# Patient Record
Sex: Female | Born: 1975 | Race: White | Hispanic: No | Marital: Married | Smoking: Never smoker
Health system: Southern US, Community
[De-identification: ages and names within clinical notes are randomized; demographics above are authoritative.]

## PROBLEM LIST (undated history)

## (undated) DIAGNOSIS — E040 Nontoxic diffuse goiter: Secondary | ICD-10-CM

## (undated) DIAGNOSIS — E012 Iodine-deficiency related (endemic) goiter, unspecified: Secondary | ICD-10-CM

## (undated) DIAGNOSIS — I10 Essential (primary) hypertension: Secondary | ICD-10-CM

## (undated) DIAGNOSIS — K8020 Calculus of gallbladder without cholecystitis without obstruction: Secondary | ICD-10-CM

## (undated) DIAGNOSIS — T7840XA Allergy, unspecified, initial encounter: Secondary | ICD-10-CM

## (undated) DIAGNOSIS — N83202 Unspecified ovarian cyst, left side: Secondary | ICD-10-CM

## (undated) HISTORY — DX: Allergy, unspecified, initial encounter: T78.40XA

## (undated) HISTORY — DX: Calculus of gallbladder without cholecystitis without obstruction: K80.20

## (undated) HISTORY — DX: Nontoxic diffuse goiter: E04.0

## (undated) HISTORY — DX: Unspecified ovarian cyst, left side: N83.202

## (undated) HISTORY — PX: OTHER SURGICAL HISTORY: SHX169

## (undated) HISTORY — DX: Iodine-deficiency related (endemic) goiter, unspecified: E01.2

## (undated) HISTORY — DX: Essential (primary) hypertension: I10

---

## 2016-07-13 LAB — HM PAP SMEAR

## 2016-11-19 ENCOUNTER — Encounter: Payer: Self-pay | Admitting: Certified Nurse Midwife

## 2016-11-23 ENCOUNTER — Ambulatory Visit (INDEPENDENT_AMBULATORY_CARE_PROVIDER_SITE_OTHER): Payer: Self-pay | Admitting: Certified Nurse Midwife

## 2016-11-23 ENCOUNTER — Encounter: Payer: Self-pay | Admitting: Certified Nurse Midwife

## 2016-11-23 VITALS — BP 147/85 | HR 80 | Ht 64.0 in | Wt 123.2 lb

## 2016-11-23 DIAGNOSIS — E049 Nontoxic goiter, unspecified: Secondary | ICD-10-CM | POA: Insufficient documentation

## 2016-11-23 DIAGNOSIS — O26899 Other specified pregnancy related conditions, unspecified trimester: Secondary | ICD-10-CM

## 2016-11-23 DIAGNOSIS — R109 Unspecified abdominal pain: Secondary | ICD-10-CM

## 2016-11-23 DIAGNOSIS — R87620 Atypical squamous cells of undetermined significance on cytologic smear of vagina (ASC-US): Secondary | ICD-10-CM

## 2016-11-23 DIAGNOSIS — K8020 Calculus of gallbladder without cholecystitis without obstruction: Secondary | ICD-10-CM | POA: Insufficient documentation

## 2016-11-23 DIAGNOSIS — R300 Dysuria: Secondary | ICD-10-CM

## 2016-11-23 DIAGNOSIS — N926 Irregular menstruation, unspecified: Secondary | ICD-10-CM

## 2016-11-23 LAB — POCT URINE PREGNANCY: Preg Test, Ur: POSITIVE — AB

## 2016-11-23 NOTE — Progress Notes (Signed)
GYN ENCOUNTER NOTE  Subjective:       Kim Conner is a 41 y.o. G1P0 female here for pregnancy confirmation.   Endorses breast tenderness and nausea. Reports dysuria, and lower abdominal cramping last Saturday and Sunday that has since resolved.   Denies difficulty breathing or respiratory distress, chest pian, abdominal pain, vaginal bleeding, and leg pain or swelling.   She is married. This was a planned pregnancy. She is taking a prenatal vitamin. She recently moved from [place] to be with her husband.    Gynecologic History  Patient's last menstrual period was 09/19/2016.  Estimated date of birth: 06/26/2015  Gestational age: 58 weeks 2 days  Last Pap: 07/2016. Results were: ASCUS; completed in [place] and treated with Tergynan vaginal tablets. Advised to have repeat Pap in three (3) months.   Obstetric History  OB History  Gravida Para Term Preterm AB Living  1            SAB TAB Ectopic Multiple Live Births               # Outcome Date GA Lbr Len/2nd Weight Sex Delivery Anes PTL Lv  1 Current               No Known Allergies  Social History   Social History  . Marital status: Married    Spouse name: N/A  . Number of children: N/A  . Years of education: N/A   Occupational History  . Not on file.   Social History Main Topics  . Smoking status: Never Smoker  . Smokeless tobacco: Never Used  . Alcohol use Yes  . Drug use: No  . Sexual activity: Yes   Other Topics Concern  . Not on file   Social History Narrative  . No narrative on file    History reviewed. No pertinent family history.  The following portions of the patient's history were reviewed and updated as appropriate: allergies, current medications, past family history, past medical history, past social history, past surgical history and problem list.  Review of Systems  Review of Systems - Negative except as noted above History obtained from the patient.   Objective:   BP (!)  147/85   Pulse 80   Ht 5\' 4"  (1.626 m)   Wt 123 lb 3.2 oz (55.9 kg)   LMP 09/19/2016   BMI 21.15 kg/m   Alert and oriented x 4, no apparent distress.   Positive UPT  Assessment:   1. Missed menses - POCT urine pregnancy - Urine Culture  2. Abdominal cramping affecting pregnancy - Urine Culture  3. Dysuria  Plan:   Samples of [place] given.   Reviewed red flag symptoms and when to call.   RTC x 1 week for dating/viability [place] and nurse intake.    Diona Fanti, CNM

## 2016-11-23 NOTE — Patient Instructions (Signed)
Eating Plan for Pregnant Women While you are pregnant, your body will require additional nutrition to help support your growing baby. It is recommended that you consume:  150 additional calories each day during your first trimester.  300 additional calories each day during your second trimester.  300 additional calories each day during your third trimester.  Eating a healthy, well-balanced diet is very important for your health and for your baby's health. You also have a higher need for some vitamins and minerals, such as folic acid, calcium, iron, and vitamin D. What do I need to know about eating during pregnancy?  Do not try to lose weight or go on a diet during pregnancy.  Choose healthy, nutritious foods. Choose  of a sandwich with a glass of milk instead of a candy bar or a high-calorie sugar-sweetened beverage.  Limit your overall intake of foods that have "empty calories." These are foods that have little nutritional value, such as sweets, desserts, candies, sugar-sweetened beverages, and fried foods.  Eat a variety of foods, especially fruits and vegetables.  Take a prenatal vitamin to help meet the additional needs during pregnancy, specifically for folic acid, iron, calcium, and vitamin D.  Remember to stay active. Ask your health care provider for exercise recommendations that are specific to you.  Practice good food safety and cleanliness, such as washing your hands before you eat and after you prepare raw meat. This helps to prevent foodborne illnesses, such as listeriosis, that can be very dangerous for your baby. Ask your health care provider for more information about listeriosis. What does 150 extra calories look like? Healthy options for an additional 150 calories each day could be any of the following:  Plain low-fat yogurt (6-8 oz) with  cup of berries.  1 apple with 2 teaspoons of peanut butter.  Cut-up vegetables with  cup of hummus.  Low-fat chocolate milk  (8 oz or 1 cup).  1 string cheese with 1 medium orange.   of a peanut butter and jelly sandwich on whole-wheat bread (1 tsp of peanut butter).  For 300 calories, you could eat two of those healthy options each day. What is a healthy amount of weight to gain? The recommended amount of weight for you to gain is based on your pre-pregnancy BMI. If your pre-pregnancy BMI was:  Less than 18 (underweight), you should gain 28-40 lb.  18-24.9 (normal), you should gain 25-35 lb.  25-29.9 (overweight), you should gain 15-25 lb.  Greater than 30 (obese), you should gain 11-20 lb.  What if I am having twins or multiples? Generally, pregnant women who will be having twins or multiples may need to increase their daily calories by 300-600 calories each day. The recommended range for total weight gain is 25-54 lb, depending on your pre-pregnancy BMI. Talk with your health care provider for specific guidance about additional nutritional needs, weight gain, and exercise during your pregnancy. What foods can I eat? Grains Any grains. Try to choose whole grains, such as whole-wheat bread, oatmeal, or brown rice. Vegetables Any vegetables. Try to eat a variety of colors and types of vegetables to get a full range of vitamins and minerals. Remember to wash your vegetables well before eating. Fruits Any fruits. Try to eat a variety of colors and types of fruit to get a full range of vitamins and minerals. Remember to wash your fruits well before eating. Meats and Other Protein Sources Lean meats, including chicken, [place], fish, and lean cuts of beef, veal,  or pork. Make sure that all meats are cooked to "well done." Tofu. Tempeh. Beans. Eggs. Peanut butter and other nut butters. Seafood, such as shrimp, crab, and lobster. If you choose fish, select types that are higher in omega-3 fatty acids, including salmon, herring, mussels, trout, sardines, and pollock. Make sure that all meats are cooked to food-safe  temperatures. Dairy Pasteurized milk and milk alternatives. Pasteurized yogurt and pasteurized cheese. Cottage cheese. Sour cream. Beverages Water. Juices that contain 100% fruit juice or vegetable juice. Caffeine-free teas and decaffeinated coffee. Drinks that contain caffeine are okay to drink, but it is better to avoid caffeine. Keep your total caffeine intake to less than 200 mg each day (12 oz of coffee, tea, or soda) or as directed by your health care provider. Condiments Any pasteurized condiments. Sweets and Desserts Any sweets and desserts. Fats and Oils Any fats and oils. The items listed above may not be a complete list of recommended foods or beverages. Contact your dietitian for more options. What foods are not recommended? Vegetables Unpasteurized (raw) vegetable juices. Fruits Unpasteurized (raw) fruit juices. Meats and Other Protein Sources Cured meats that have nitrates, such as bacon, salami, and hotdogs. Luncheon meats, bologna, or other deli meats (unless they are reheated until they are steaming hot). Refrigerated pate, meat spreads from a meat counter, smoked seafood that is found in the refrigerated section of a store. Raw fish, such as sushi or sashimi. High mercury content fish, such as tilefish, shark, swordfish, and king mackerel. Raw meats, such as tuna or beef tartare. Undercooked meats and poultry. Make sure that all meats are cooked to food-safe temperatures. Dairy Unpasteurized (raw) milk and any foods that have raw milk in them. Soft cheeses, such as feta, queso blanco, queso fresco, Brie, Camembert cheeses, blue-veined cheeses, and Panela cheese (unless it is made with pasteurized milk, which must be stated on the label). Beverages Alcohol. Sugar-sweetened beverages, such as sodas, teas, or energy drinks. Condiments Homemade fermented foods and drinks, such as pickles, sauerkraut, or kombucha drinks. (Store-bought pasteurized versions of these are  okay.) Other Salads that are made in the store, such as ham salad, chicken salad, egg salad, tuna salad, and seafood salad. The items listed above may not be a complete list of foods and beverages to avoid. Contact your dietitian for more information. This information is not intended to replace advice given to you by your health care provider. Make sure you discuss any questions you have with your health care provider. Document Released: 12/07/2013 Document Revised: 07/31/2015 Document Reviewed: 08/07/2013 Elsevier Interactive Patient Education  2018 [employer]. Morning Sickness Morning sickness is when you feel sick to your stomach (nauseous) during pregnancy. This nauseous feeling may or may not come with vomiting. It often occurs in the morning but can be a problem any time of day. Morning sickness is most common during the first trimester, but it may continue throughout pregnancy. While morning sickness is unpleasant, it is usually harmless unless you develop severe and continual vomiting (hyperemesis gravidarum). This condition requires more intense treatment. What are the causes? The cause of morning sickness is not completely known but seems to be related to normal hormonal changes that occur in pregnancy. What increases the risk? You are at greater risk if you:  Experienced nausea or vomiting before your pregnancy.  Had morning sickness during a previous pregnancy.  Are pregnant with more than one baby, such as twins.  How is this treated? Do not use any medicines (prescription,  over-the-counter, or herbal) for morning sickness without first talking to your health care provider. Your health care provider may prescribe or recommend:  Vitamin B6 supplements.  Anti-nausea medicines.  The herbal medicine ginger.  Follow these instructions at home:  Only take over-the-counter or prescription medicines as directed by your health care provider.  Taking multivitamins before  getting pregnant can prevent or decrease the severity of morning sickness in most women.  Eat a piece of dry toast or unsalted crackers before getting out of bed in the morning.  Eat five or six small meals a day.  Eat dry and bland foods (rice, baked potato). Foods high in carbohydrates are often helpful.  Do not drink liquids with your meals. Drink liquids between meals.  Avoid greasy, fatty, and spicy foods.  Get someone to cook for you if the smell of any food causes nausea and vomiting.  If you feel nauseous after taking prenatal vitamins, take the vitamins at night or with a snack.  Snack on protein foods (nuts, yogurt, cheese) between meals if you are hungry.  Eat unsweetened gelatins for desserts.  Wearing an acupressure wristband (worn for sea sickness) may be helpful.  Acupuncture may be helpful.  Do not smoke.  Get a humidifier to keep the air in your house free of odors.  Get plenty of fresh air. Contact a health care provider if:  Your home remedies are not working, and you need medicine.  You feel dizzy or lightheaded.  You are losing weight. Get help right away if:  You have persistent and uncontrolled nausea and vomiting.  You pass out (faint). This information is not intended to replace advice given to you by your health care provider. Make sure you discuss any questions you have with your health care provider. Document Released: 04/15/2006 Document Revised: 07/31/2015 Document Reviewed: 08/09/2012 Elsevier Interactive Patient Education  2017 [place]. Common Medications Safe in Pregnancy  Acne:      Constipation:  Benzoyl Peroxide     Colace  Clindamycin      Dulcolax Suppository  Topica Erythromycin     Fibercon  Salicylic Acid      Metamucil         Miralax AVOID:        Senakot   Accutane    Cough:  Retin-A       Cough Drops  Tetracycline      Phenergan w/ Codeine if Rx  Minocycline      Robitussin (Plain &  DM)  Antibiotics:     Crabs/Lice:  Ceclor       RID  Cephalosporins    AVOID:  E-Mycins      Kwell  Keflex  Macrobid/Macrodantin   Diarrhea:  Penicillin      Kao-Pectate  Zithromax      Imodium AD         PUSH FLUIDS AVOID:       Cipro     Fever:  Tetracycline      Tylenol (Regular or Extra  Minocycline       Strength)  Levaquin      Extra Strength-Do not          Exceed 8 tabs/24 hrs Caffeine:        <273m/day (equiv. To 1 cup of coffee or  approx. 3 12 oz sodas)         Gas: Cold/Hayfever:       Gas-X  Benadryl      Mylicon  Claritin  Phazyme  **Claritin-D        Chlor-Trimeton    Headaches:  Dimetapp      ASA-Free Excedrin  Drixoral-Non-Drowsy     Cold Compress  Mucinex (Guaifenasin)     Tylenol (Regular or Extra  Sudafed/Sudafed-12 Hour     Strength)  **Sudafed PE Pseudoephedrine   Tylenol Cold & Sinus     Vicks Vapor Rub  Zyrtec  **AVOID if Problems With Blood Pressure         Heartburn: Avoid lying down for at least 1 hour after meals  Aciphex      Maalox     Rash:  Milk of Magnesia     Benadryl    Mylanta       1% Hydrocortisone Cream  Pepcid  Pepcid Complete   Sleep Aids:  Prevacid      Ambien   Prilosec       Benadryl  Rolaids       Chamomile Tea  Tums (Limit 4/day)     Unisom  Zantac       Tylenol PM         Warm milk-add vanilla or  Hemorrhoids:       Sugar for taste  Anusol/Anusol H.C.  (RX: Analapram 2.5%)  Sugar Substitutes:  Hydrocortisone OTC     Ok in moderation  Preparation H      Tucks        Vaseline lotion applied to tissue with wiping    Herpes:     Throat:  Acyclovir      Oragel  Famvir  Valtrex     Vaccines:         Flu Shot Leg Cramps:       *Gardasil  Benadryl      Hepatitis A         Hepatitis B Nasal Spray:       Pneumovax  Saline Nasal Spray     Polio Booster         Tetanus Nausea:       Tuberculosis test or PPD  Vitamin B6 25 mg TID   AVOID:    Dramamine      *Gardasil  Emetrol       Live  Poliovirus  Ginger Root 250 mg QID    MMR (measles, mumps &  High Complex Carbs @ Bedtime    rebella)  Sea Bands-Accupressure    Varicella (Chickenpox)  Unisom 1/2 tab TID     *No known complications           If received before Pain:         Known pregnancy;   Darvocet       Resume series after  Lortab        Delivery  Percocet    Yeast:   Tramadol      Femstat  Tylenol 3      Gyne-lotrimin  Ultram       Monistat  Vicodin           MISC:         All Sunscreens           Hair Coloring/highlights          Insect Repellant's          (Including DEET)         Mystic Tans First Trimester of Pregnancy The first trimester of pregnancy is from week 1 until the end of week 13 (months 1 through 3). A week  after a sperm fertilizes an egg, the egg will implant on the wall of the uterus. This embryo will begin to develop into a baby. Genes from you and your partner will form the baby. The female genes will determine whether the baby will be a boy or a girl. At 6-8 weeks, the eyes and face will be formed, and the heartbeat can be seen on ultrasound. At the end of 12 weeks, all the baby's organs will be formed. Now that you are pregnant, you will want to do everything you can to have a healthy baby. Two of the most important things are to get good prenatal care and to follow your health care provider's instructions. Prenatal care is all the medical care you receive before the baby's birth. This care will help prevent, find, and treat any problems during the pregnancy and childbirth. Body changes during your first trimester Your body goes through many changes during pregnancy. The changes vary from woman to woman.  You may gain or lose a couple of pounds at first.  You may feel sick to your stomach (nauseous) and you may throw up (vomit). If the vomiting is uncontrollable, call your health care provider.  You may tire easily.  You may develop headaches that can be relieved by medicines. All medicines  should be approved by your health care provider.  You may urinate more often. Painful urination may mean you have a bladder infection.  You may develop heartburn as a result of your pregnancy.  You may develop constipation because certain hormones are causing the muscles that push stool through your intestines to slow down.  You may develop hemorrhoids or swollen veins (varicose veins).  Your breasts may begin to grow larger and become tender. Your nipples may stick out more, and the tissue that surrounds them (areola) may become darker.  Your gums may bleed and may be sensitive to brushing and flossing.  Dark spots or blotches (chloasma, mask of pregnancy) may develop on your face. This will likely fade after the baby is born.  Your menstrual periods will stop.  You may have a loss of appetite.  You may develop cravings for certain kinds of food.  You may have changes in your emotions from day to day, such as being excited to be pregnant or being concerned that something may go wrong with the pregnancy and baby.  You may have more vivid and strange dreams.  You may have changes in your hair. These can include thickening of your hair, rapid growth, and changes in texture. Some women also have hair loss during or after pregnancy, or hair that feels dry or thin. Your hair will most likely return to normal after your baby is born.  What to expect at prenatal visits During a routine prenatal visit:  You will be weighed to make sure you and the baby are growing normally.  Your blood pressure will be taken.  Your abdomen will be measured to track your baby's growth.  The fetal heartbeat will be listened to between weeks 10 and 14 of your pregnancy.  Test results from any previous visits will be discussed.  Your health care provider may ask you:  How you are feeling.  If you are feeling the baby move.  If you have had any abnormal symptoms, such as leaking fluid, bleeding,  severe headaches, or abdominal cramping.  If you are using any tobacco products, including cigarettes, chewing tobacco, and electronic cigarettes.  If you have any questions.  Other tests that may be performed during your first trimester include:  Blood tests to find your blood type and to check for the presence of any previous infections. The tests will also be used to check for low iron levels (anemia) and protein on red blood cells (Rh antibodies). Depending on your risk factors, or if you previously had diabetes during pregnancy, you may have tests to check for high blood sugar that affects pregnant women (gestational diabetes).  Urine tests to check for infections, diabetes, or protein in the urine.  An ultrasound to confirm the proper growth and development of the baby.  Fetal screens for spinal cord problems (spina bifida) and Down syndrome.  HIV (human immunodeficiency virus) testing. Routine prenatal testing includes screening for HIV, unless you choose not to have this test.  You may need other tests to make sure you and the baby are doing well.  Follow these instructions at home: Medicines  Follow your health care provider's instructions regarding medicine use. Specific medicines may be either safe or unsafe to take during pregnancy.  Take a prenatal vitamin that contains at least 600 micrograms (mcg) of folic acid.  If you develop constipation, try taking a stool softener if your health care provider approves. Eating and drinking  Eat a balanced diet that includes fresh fruits and vegetables, whole grains, good sources of protein such as meat, eggs, or tofu, and low-fat dairy. Your health care provider will help you determine the amount of weight gain that is right for you.  Avoid raw meat and uncooked cheese. These carry germs that can cause birth defects in the baby.  Eating four or five small meals rather than three large meals a day may help relieve nausea and  vomiting. If you start to feel nauseous, eating a few soda crackers can be helpful. Drinking liquids between meals, instead of during meals, also seems to help ease nausea and vomiting.  Limit foods that are high in fat and processed sugars, such as fried and sweet foods.  To prevent constipation: ? Eat foods that are high in fiber, such as fresh fruits and vegetables, whole grains, and beans. ? Drink enough fluid to keep your urine clear or pale yellow. Activity  Exercise only as directed by your health care provider. Most women can continue their usual exercise routine during pregnancy. Try to exercise for 30 minutes at least 5 days a week. Exercising will help you: ? Control your weight. ? Stay in shape. ? Be prepared for labor and delivery.  Experiencing pain or cramping in the lower abdomen or lower back is a good sign that you should stop exercising. Check with your health care provider before continuing with normal exercises.  Try to avoid standing for long periods of time. Move your legs often if you must stand in one place for a long time.  Avoid heavy lifting.  Wear low-heeled shoes and practice good posture.  You may continue to have sex unless your health care provider tells you not to. Relieving pain and discomfort  Wear a good support bra to relieve breast tenderness.  Take warm sitz baths to soothe any pain or discomfort caused by hemorrhoids. Use hemorrhoid cream if your health care provider approves.  Rest with your legs elevated if you have leg cramps or low back pain.  If you develop varicose veins in your legs, wear support hose. Elevate your feet for 15 minutes, 3-4 times a day. Limit salt in your diet. Prenatal care  Schedule your prenatal visits by the twelfth week of pregnancy. They are usually scheduled monthly at first, then more often in the last 2 months before delivery.  Write down your questions. Take them to your prenatal visits.  Keep all your  prenatal visits as told by your health care provider. This is important. Safety  Wear your seat belt at all times when driving.  Make a list of emergency phone numbers, including numbers for family, friends, the hospital, and police and fire departments. General instructions  Ask your health care provider for a referral to a local prenatal education class. Begin classes no later than the beginning of month 6 of your pregnancy.  Ask for help if you have counseling or nutritional needs during pregnancy. Your health care provider can offer advice or refer you to specialists for help with various needs.  Do not use hot tubs, steam rooms, or saunas.  Do not douche or use tampons or scented sanitary pads.  Do not cross your legs for long periods of time.  Avoid cat litter boxes and soil used by cats. These carry germs that can cause birth defects in the baby and possibly loss of the fetus by miscarriage or stillbirth.  Avoid all smoking, herbs, alcohol, and medicines not prescribed by your health care provider. Chemicals in these products affect the formation and growth of the baby.  Do not use any products that contain nicotine or tobacco, such as cigarettes and e-cigarettes. If you need help quitting, ask your health care provider. You may receive counseling support and other resources to help you quit.  Schedule a dentist appointment. At home, brush your teeth with a soft toothbrush and be gentle when you floss. Contact a health care provider if:  You have dizziness.  You have mild pelvic cramps, pelvic pressure, or nagging pain in the abdominal area.  You have persistent nausea, vomiting, or diarrhea.  You have a bad smelling vaginal discharge.  You have pain when you urinate.  You notice increased swelling in your face, hands, legs, or ankles.  You are exposed to fifth disease or chickenpox.  You are exposed to [place] measles (rubella) and have never had it. Get help right  away if:  You have a fever.  You are leaking fluid from your vagina.  You have spotting or bleeding from your vagina.  You have severe abdominal cramping or pain.  You have rapid weight gain or loss.  You vomit blood or material that looks like coffee grounds.  You develop a severe headache.  You have shortness of breath.  You have any kind of trauma, such as from a fall or a car accident. Summary  The first trimester of pregnancy is from week 1 until the end of week 13 (months 1 through 3).  Your body goes through many changes during pregnancy. The changes vary from woman to woman.  You will have routine prenatal visits. During those visits, your health care provider will examine you, discuss any test results you may have, and talk with you about how you are feeling. This information is not intended to replace advice given to you by your health care provider. Make sure you discuss any questions you have with your health care provider. Document Released: 02/16/2001 Document Revised: 02/04/2016 Document Reviewed: 02/04/2016 Elsevier Interactive Patient Education  2017 [employer].

## 2016-11-24 ENCOUNTER — Other Ambulatory Visit: Payer: Self-pay | Admitting: Obstetrics and Gynecology

## 2016-11-24 DIAGNOSIS — Z369 Encounter for antenatal screening, unspecified: Secondary | ICD-10-CM

## 2016-12-06 ENCOUNTER — Ambulatory Visit (INDEPENDENT_AMBULATORY_CARE_PROVIDER_SITE_OTHER): Payer: Self-pay

## 2016-12-06 ENCOUNTER — Ambulatory Visit: Payer: Self-pay | Admitting: Certified Nurse Midwife

## 2016-12-06 VITALS — BP 133/89 | HR 92 | Ht 64.0 in | Wt 123.4 lb

## 2016-12-06 DIAGNOSIS — Z369 Encounter for antenatal screening, unspecified: Secondary | ICD-10-CM

## 2016-12-06 DIAGNOSIS — Z113 Encounter for screening for infections with a predominantly sexual mode of transmission: Secondary | ICD-10-CM

## 2016-12-06 DIAGNOSIS — Z3401 Encounter for supervision of normal first pregnancy, first trimester: Secondary | ICD-10-CM

## 2016-12-06 DIAGNOSIS — Z1389 Encounter for screening for other disorder: Secondary | ICD-10-CM

## 2016-12-06 NOTE — Patient Instructions (Signed)
Pregnancy and Zika Virus Disease Zika virus disease, or Zika, is an illness that can spread to people from mosquitoes that carry the virus. It may also spread from person to person through infected body fluids. Zika first occurred in [place], but recently it has spread to new areas. The virus occurs in tropical climates. The location of Zika continues to change. Most people who become infected with Zika virus do not develop serious illness. However, Zika may cause birth defects in an unborn baby whose mother is infected with the virus. It may also increase the risk of miscarriage. What are the symptoms of Zika virus disease? In many cases, people who have been infected with Zika virus do not develop any symptoms. If symptoms appear, they usually start about a week after the person is infected. Symptoms are usually mild. They may include:  Fever.  Rash.  Red eyes.  Joint pain.  How does Zika virus disease spread? The main way that [place] virus spreads is through the bite of a certain type of mosquito. Unlike most types of mosquitos, which bite only at night, the type of mosquito that carries Zika virus bites both at night and during the day. Zika virus can also spread through sexual contact, through a blood transfusion, and from a mother to her baby before or during birth. Once you have had Zika virus disease, it is unlikely that you will get it again. Can I pass Zika to my baby during pregnancy? Yes, Zika can pass from a mother to her baby before or during birth. What problems can Zika cause for my baby? A woman who is infected with Zika virus while pregnant is at risk of having her baby born with a condition in which the brain or head is smaller than expected (microcephaly). Babies who have microcephaly can have developmental delays, seizures, hearing problems, and vision problems. Having Zika virus disease during pregnancy can also increase the risk of miscarriage. How can Zika virus disease be  prevented? There is no vaccine to prevent Zika. The best way to prevent the disease is to avoid infected mosquitoes and avoid exposure to body fluids that can spread the virus. Avoid any possible exposure to [place] by taking the following precautions. For women and their sex partners:  Avoid traveling to high-risk areas. The locations where [place] is being reported change often. To identify high-risk areas, check the CDC travel website: CreditChaos.com.ee  If you or your sex partner must travel to a high-risk area, talk with a health care provider before and after traveling.  Take all precautions to avoid mosquito bites if you live in, or travel to, any of the high-risk areas. Insect repellents are safe to use during pregnancy.  Ask your health care provider when it is safe to have sexual contact.  For women:  If you are pregnant or trying to become pregnant, avoid sexual contact with persons who may have been exposed to [place] virus, persons who have possible symptoms of Zika, or persons whose history you are unsure about. If you choose to have sexual contact with someone who may have been exposed to [place] virus, use condoms correctly during the entire duration of sexual activity, every time. Do not share sexual devices, as you may be exposed to body fluids.  Ask your health care provider about when it is safe to attempt pregnancy after a possible exposure to [place] virus.  What steps should I take to avoid mosquito bites? Take these steps to avoid mosquito bites  when you are in a high-risk area:  Wear loose clothing that covers your arms and legs.  Limit your outdoor activities.  Do not open windows unless they have window screens.  Sleep under mosquito nets.  Use insect repellent. The best insect repellents have:  DEET, picaridin, oil of lemon eucalyptus (OLE), or IR3535 in them.  Higher amounts of an active ingredient in them.  Remember that insect repellents are safe to  use during pregnancy.  Do not use OLE on children who are younger than 28 years of age. Do not use insect repellent on babies who are younger than 76 months of age.  Cover your child's stroller with mosquito netting. Make sure the netting fits snugly and that any loose netting does not cover your child's mouth or nose. Do not use a blanket as a mosquito-protection cover.  Do not apply insect repellent underneath clothing.  If you are using sunscreen, apply the sunscreen before applying the insect repellent.  Treat clothing with permethrin. Do not apply permethrin directly to your skin. Follow label directions for safe use.  Get rid of standing water, where mosquitoes may reproduce. Standing water is often found in items such as buckets, bowls, animal food dishes, and flowerpots.  When you return from traveling to any high-risk area, continue taking actions to protect yourself against mosquito bites for 3 weeks, even if you show no signs of illness. This will prevent spreading Zika virus to uninfected mosquitoes. What should I know about the sexual transmission of Zika? People can spread Zika to their sexual partners during vaginal, anal, or oral sex, or by sharing sexual devices. Many people with [place] do not develop symptoms, so a person could spread the disease without knowing that they are infected. The greatest risk is to women who are pregnant or who may become pregnant. Zika virus can live longer in semen than it can live in blood. Couples can prevent sexual transmission of the virus by:  Using condoms correctly during the entire duration of sexual activity, every time. This includes vaginal, anal, and oral sex.  Not sharing sexual devices. Sharing increases your risk of being exposed to body fluid from another person.  Avoiding all sexual activity until your health care provider says it is safe.  Should I be tested for Zika virus? A sample of your blood can be tested for Zika virus. A  pregnant woman should be tested if she may have been exposed to the virus or if she has symptoms of Zika. She may also have additional tests done during her pregnancy, such ultrasound testing. Talk with your health care provider about which tests are recommended. This information is not intended to replace advice given to you by your health care provider. Make sure you discuss any questions you have with your health care provider. Document Released: 11/13/2014 Document Revised: 07/31/2015 Document Reviewed: 11/06/2014 Elsevier Interactive Patient Education  2018 [employer]. Hyperemesis Gravidarum Hyperemesis gravidarum is a severe form of nausea and vomiting that happens during pregnancy. Hyperemesis is worse than morning sickness. It may cause you to have nausea or vomiting all day for many days. It may keep you from eating and drinking enough food and liquids. Hyperemesis usually occurs during the first half (the first 20 weeks) of pregnancy. It often goes away once a woman is in her second half of pregnancy. However, sometimes hyperemesis continues through an entire pregnancy. What are the causes? The cause of this condition is not known. It may be related  to changes in chemicals (hormones) in the body during pregnancy, such as the high level of pregnancy hormone (human chorionic gonadotropin) or the increase in the female sex hormone (estrogen). What are the signs or symptoms? Symptoms of this condition include:  Severe nausea and vomiting.  Nausea that does not go away.  Vomiting that does not allow you to keep any food down.  Weight loss.  Body fluid loss (dehydration).  Having no desire to eat, or not liking food that you have previously enjoyed.  How is this diagnosed? This condition may be diagnosed based on:  A physical exam.  Your medical history.  Your symptoms.  Blood tests.  Urine tests.  How is this treated? This condition may be managed with medicine. If  medicines to do not help relieve nausea and vomiting, you may need to receive fluids through an IV tube at the hospital. Follow these instructions at home:  Take over-the-counter and prescription medicines only as told by your health care provider.  Avoid iron pills and multivitamins that contain iron for the first 3-4 months of pregnancy. If you take prescription iron pills, do not stop taking them unless your health care provider approves.  Take the following actions to help prevent nausea and vomiting: ? In the morning, before getting out of bed, try eating a couple of dry crackers or a piece of toast. ? Avoid foods and smells that upset your stomach. Fatty and spicy foods may make nausea worse. ? Eat 5-6 small meals a day. ? Do not drink fluids while eating meals. Drink between meals. ? Eat or suck on things that have ginger in them. Ginger can help relieve nausea. ? Avoid food preparation. The smell of food can spoil your appetite or trigger nausea.  Follow instructions from your health care provider about eating or drinking restrictions.  For snacks, eat high-protein foods, such as cheese.  Keep all follow-up and pre-birth (prenatal) visits as told by your health care provider. This is important. Contact a health care provider if:  You have pain in your abdomen.  You have a severe headache.  You have vision problems.  You are losing weight. Get help right away if:  You cannot drink fluids without vomiting.  You vomit blood.  You have constant nausea and vomiting.  You are very weak.  You are very thirsty.  You feel dizzy.  You faint.  You have a fever or other symptoms that last for more than 2-3 days.  You have a fever and your symptoms suddenly get worse. Summary  Hyperemesis gravidarum is a severe form of nausea and vomiting that happens during pregnancy.  Making some changes to your eating habits may help relieve nausea and vomiting.  This condition may  be managed with medicine.  If medicines to do not help relieve nausea and vomiting, you may need to receive fluids through an IV tube at the hospital. This information is not intended to replace advice given to you by your health care provider. Make sure you discuss any questions you have with your health care provider. Document Released: 02/22/2005 Document Revised: 10/22/2015 Document Reviewed: 10/22/2015 Elsevier Interactive Patient Education  2017 [place] of Pregnancy The first trimester of pregnancy is from week 1 until the end of week 13 (months 1 through 3). During this time, your baby will begin to develop inside you. At 6-8 weeks, the eyes and face are formed, and the heartbeat can be seen on ultrasound. At the  end of 12 weeks, all the baby's organs are formed. Prenatal care is all the medical care you receive before the birth of your baby. Make sure you get good prenatal care and follow all of your doctor's instructions. Follow these instructions at home: Medicines  Take over-the-counter and prescription medicines only as told by your doctor. Some medicines are safe and some medicines are not safe during pregnancy.  Take a prenatal vitamin that contains at least 600 micrograms (mcg) of folic acid.  If you have trouble pooping (constipation), take medicine that will make your stool soft (stool softener) if your doctor approves. Eating and drinking  Eat regular, healthy meals.  Your doctor will tell you the amount of weight gain that is right for you.  Avoid raw meat and uncooked cheese.  If you feel sick to your stomach (nauseous) or throw up (vomit): ? Eat 4 or 5 small meals a day instead of 3 large meals. ? Try eating a few soda crackers. ? Drink liquids between meals instead of during meals.  To prevent constipation: ? Eat foods that are high in fiber, like fresh fruits and vegetables, whole grains, and beans. ? Drink enough fluids to keep your pee  (urine) clear or pale yellow. Activity  Exercise only as told by your doctor. Stop exercising if you have cramps or pain in your lower belly (abdomen) or low back.  Do not exercise if it is too hot, too humid, or if you are in a place of great height (high altitude).  Try to avoid standing for long periods of time. Move your legs often if you must stand in one place for a long time.  Avoid heavy lifting.  Wear low-heeled shoes. Sit and stand up straight.  You can have sex unless your doctor tells you not to. Relieving pain and discomfort  Wear a good support bra if your breasts are sore.  Take warm water baths (sitz baths) to soothe pain or discomfort caused by hemorrhoids. Use hemorrhoid cream if your doctor says it is okay.  Rest with your legs raised if you have leg cramps or low back pain.  If you have puffy, bulging veins (varicose veins) in your legs: ? Wear support hose or compression stockings as told by your doctor. ? Raise (elevate) your feet for 15 minutes, 3-4 times a day. ? Limit salt in your food. Prenatal care  Schedule your prenatal visits by the twelfth week of pregnancy.  Write down your questions. Take them to your prenatal visits.  Keep all your prenatal visits as told by your doctor. This is important. Safety  Wear your seat belt at all times when driving.  Make a list of emergency phone numbers. The list should include numbers for family, friends, the hospital, and police and fire departments. General instructions  Ask your doctor for a referral to a local prenatal class. Begin classes no later than at the start of month 6 of your pregnancy.  Ask for help if you need counseling or if you need help with nutrition. Your doctor can give you advice or tell you where to go for help.  Do not use hot tubs, steam rooms, or saunas.  Do not douche or use tampons or scented sanitary pads.  Do not cross your legs for long periods of time.  Avoid all herbs  and alcohol. Avoid drugs that are not approved by your doctor.  Do not use any tobacco products, including cigarettes, chewing tobacco, and electronic cigarettes.  If you need help quitting, ask your doctor. You may get counseling or other support to help you quit.  Avoid cat litter boxes and soil used by cats. These carry germs that can cause birth defects in the baby and can cause a loss of your baby (miscarriage) or stillbirth.  Visit your dentist. At home, brush your teeth with a soft toothbrush. Be gentle when you floss. Contact a doctor if:  You are dizzy.  You have mild cramps or pressure in your lower belly.  You have a nagging pain in your belly area.  You continue to feel sick to your stomach, you throw up, or you have watery poop (diarrhea).  You have a bad smelling fluid coming from your vagina.  You have pain when you pee (urinate).  You have increased puffiness (swelling) in your face, hands, legs, or ankles. Get help right away if:  You have a fever.  You are leaking fluid from your vagina.  You have spotting or bleeding from your vagina.  You have very bad belly cramping or pain.  You gain or lose weight rapidly.  You throw up blood. It may look like coffee grounds.  You are around people who have [place] measles, fifth disease, or chickenpox.  You have a very bad headache.  You have shortness of breath.  You have any kind of trauma, such as from a fall or a car accident. Summary  The first trimester of pregnancy is from week 1 until the end of week 13 (months 1 through 3).  To take care of yourself and your unborn baby, you will need to eat healthy meals, take medicines only if your doctor tells you to do so, and do activities that are safe for you and your baby.  Keep all follow-up visits as told by your doctor. This is important as your doctor will have to ensure that your baby is healthy and growing well. This information is not intended to replace  advice given to you by your health care provider. Make sure you discuss any questions you have with your health care provider. Document Released: 08/11/2007 Document Revised: 03/02/2016 Document Reviewed: 03/02/2016 Elsevier Interactive Patient Education  2017 [place]. Commonly Asked Questions During Pregnancy  Cats: A parasite can be excreted in cat feces.  To avoid exposure you need to have another person empty the little box.  If you must empty the litter box you will need to wear gloves.  Wash your hands after handling your cat.  This parasite can also be found in raw or undercooked meat so this should also be avoided.  Colds, Sore Throats, Flu: Please check your medication sheet to see what you can take for symptoms.  If your symptoms are unrelieved by these medications please call the office.  Dental Work: Most any dental work [employer] recommends is permitted.  X-rays should only be taken during the first trimester if absolutely necessary.  Your abdomen should be shielded with a lead apron during all x-rays.  Please notify your provider prior to receiving any x-rays.  Novocaine is fine; gas is not recommended.  If your dentist requires a note from [place] prior to dental work please call the office and we will provide one for you.  Exercise: Exercise is an important part of staying healthy during your pregnancy.  You may continue most exercises you were accustomed to prior to pregnancy.  Later in your pregnancy you will most likely notice you have difficulty with activities  requiring balance like riding a bicycle.  It is important that you listen to your body and avoid activities that put you at a higher risk of falling.  Adequate rest and staying well hydrated are a must!  If you have questions about the safety of specific activities ask your provider.    Exposure to Children with illness: Try to avoid obvious exposure; report any symptoms to [place] when noted,  If you have chicken pos, red  measles or mumps, you should be immune to these diseases.   Please do not take any vaccines while pregnant unless you have checked with your OB provider.  Fetal Movement: After 28 weeks we recommend you do "kick counts" twice daily.  Lie or sit down in a calm quiet environment and count your baby movements "kicks".  You should feel your baby at least 10 times per hour.  If you have not felt 10 kicks within the first hour get up, walk around and have something sweet to eat or drink then repeat for an additional hour.  If count remains less than 10 per hour notify your provider.  Fumigating: Follow your pest control agent's advice as to how long to stay out of your home.  Ventilate the area well before re-entering.  Hemorrhoids:   Most over-the-counter preparations can be used during pregnancy.  Check your medication to see what is safe to use.  It is important to use a stool softener or fiber in your diet and to drink lots of liquids.  If hemorrhoids seem to be getting worse please call the office.   Hot Tubs:  Hot tubs Jacuzzis and saunas are not recommended while pregnant.  These increase your internal body temperature and should be avoided.  Intercourse:  Sexual intercourse is safe during pregnancy as long as you are comfortable, unless otherwise advised by your provider.  Spotting may occur after intercourse; report any bright red bleeding that is heavier than spotting.  Labor:  If you know that you are in labor, please go to the hospital.  If you are unsure, please call the office and let us help you decide what to do.  Lifting, straining, etc:  If your job requires heavy lifting or straining please check with your provider for any limitations.  Generally, you should not lift items heavier than that you can lift simply with your hands and arms (no back muscles)  Painting:  Paint fumes do not harm your pregnancy, but may make you ill and should be avoided if possible.  Latex or water based paints  have less odor than oils.  Use adequate ventilation while painting.  Permanents & Hair Color:  Chemicals in hair dyes are not recommended as they cause increase hair dryness which can increase hair loss during pregnancy.  " Highlighting" and permanents are allowed.  Dye may be absorbed differently and permanents may not hold as well during pregnancy.  Sunbathing:  Use a sunscreen, as skin burns easily during pregnancy.  Drink plenty of fluids; avoid over heating.  Tanning Beds:  Because their possible side effects are still unknown, tanning beds are not recommended.  Ultrasound Scans:  Routine ultrasounds are performed at approximately 20 weeks.  You will be able to see your baby's general anatomy an if you would like to know the gender this can usually be determined as well.  If it is questionable when you conceived you may also receive an ultrasound early in your pregnancy for dating purposes.  Otherwise ultrasound exams  are not routinely performed unless there is a medical necessity.  Although you can request a scan we ask that you pay for it when conducted because insurance does not cover " patient request" scans.  Work: If your pregnancy proceeds without complications you may work until your due date, unless your physician or employer advises otherwise.  Round Ligament Pain/Pelvic Discomfort:  Sharp, shooting pains not associated with bleeding are fairly common, usually occurring in the second trimester of pregnancy.  They tend to be worse when standing up or when you remain standing for long periods of time.  These are the result of pressure of certain pelvic ligaments called "round ligaments".  Rest, Tylenol and heat seem to be the most effective relief.  As the womb and fetus grow, they rise out of the pelvis and the discomfort improves.  Please notify the office if your pain seems different than that described.  It may represent a more serious condition.   

## 2016-12-06 NOTE — Progress Notes (Signed)
I have reviewed the record and concur with patient management and plan.    Jenkins Michelle Lawhorn, CNM [facility] 

## 2016-12-06 NOTE — Progress Notes (Signed)
Kim Conner presents for NOB nurse interview visit. Pregnancy confirmation done 11/23/2016, UPT positive. G-1.  P-0. Ultrasound today is consistent with LMP: 09/19/2016. Pregnancy education material explained and given. No cats in the home. NOB labs ordered.  HIV labs and Drug screen were explained optional and she did not decline. Drug screen ordered. PNV encouraged. Genetic screening options discussed. Genetic testing: Unsure.  Pt may discuss with provider. Pt. To follow up with provider in 1-2  weeks for NOB physical.  All questions answered.

## 2016-12-08 LAB — MONITOR DRUG PROFILE 14(MW)
Amphetamine Scrn, Ur: NEGATIVE ng/mL
BARBITURATE SCREEN URINE: NEGATIVE ng/mL
BENZODIAZEPINE SCREEN, URINE: NEGATIVE ng/mL
BUPRENORPHINE, URINE: NEGATIVE ng/mL
CANNABINOIDS UR QL SCN: NEGATIVE ng/mL
CREATININE(CRT), U: 25.3 mg/dL (ref 20.0–300.0)
Cocaine (Metab) Scrn, Ur: NEGATIVE ng/mL
FENTANYL, URINE: NEGATIVE pg/mL
METHADONE SCREEN, URINE: NEGATIVE ng/mL
Meperidine Screen, Urine: NEGATIVE ng/mL
OXYCODONE+OXYMORPHONE UR QL SCN: NEGATIVE ng/mL
Opiate Scrn, Ur: NEGATIVE ng/mL
PH UR, DRUG SCRN: 5.2 (ref 4.5–8.9)
PHENCYCLIDINE QUANTITATIVE URINE: NEGATIVE ng/mL
PROPOXYPHENE SCREEN URINE: NEGATIVE ng/mL
SPECIFIC GRAVITY: 1.01
Tramadol Screen, Urine: NEGATIVE ng/mL

## 2016-12-08 LAB — CULTURE, OB URINE

## 2016-12-08 LAB — URINALYSIS, ROUTINE W REFLEX MICROSCOPIC
Bilirubin, UA: NEGATIVE
GLUCOSE, UA: NEGATIVE
Ketones, UA: NEGATIVE
Leukocytes, UA: NEGATIVE
NITRITE UA: NEGATIVE
Protein, UA: NEGATIVE
RBC, UA: NEGATIVE
Specific Gravity, UA: 1.009 (ref 1.005–1.030)
Urobilinogen, Ur: 0.2 mg/dL (ref 0.2–1.0)
pH, UA: 5.5 (ref 5.0–7.5)

## 2016-12-08 LAB — NICOTINE SCREEN, URINE: Cotinine Ql Scrn, Ur: NEGATIVE ng/mL

## 2016-12-08 LAB — URINE CULTURE, OB REFLEX: Organism ID, Bacteria: NO GROWTH

## 2016-12-08 LAB — GC/CHLAMYDIA PROBE AMP
CHLAMYDIA, DNA PROBE: NEGATIVE
Neisseria gonorrhoeae by PCR: NEGATIVE

## 2016-12-14 ENCOUNTER — Other Ambulatory Visit: Payer: Self-pay

## 2016-12-14 ENCOUNTER — Ambulatory Visit (INDEPENDENT_AMBULATORY_CARE_PROVIDER_SITE_OTHER): Payer: Self-pay | Admitting: Certified Nurse Midwife

## 2016-12-14 ENCOUNTER — Encounter: Payer: Self-pay | Admitting: Certified Nurse Midwife

## 2016-12-14 VITALS — BP 124/89 | HR 82 | Wt 123.3 lb

## 2016-12-14 DIAGNOSIS — Z3401 Encounter for supervision of normal first pregnancy, first trimester: Secondary | ICD-10-CM

## 2016-12-14 DIAGNOSIS — R87620 Atypical squamous cells of undetermined significance on cytologic smear of vagina (ASC-US): Secondary | ICD-10-CM

## 2016-12-14 LAB — POCT URINALYSIS DIPSTICK
BILIRUBIN UA: NEGATIVE
Blood, UA: NEGATIVE
GLUCOSE UA: NEGATIVE
KETONES UA: NEGATIVE
LEUKOCYTES UA: NEGATIVE
Nitrite, UA: NEGATIVE
PROTEIN UA: NEGATIVE
SPEC GRAV UA: 1.02 (ref 1.010–1.025)
Urobilinogen, UA: 0.2 E.U./dL
pH, UA: 6.5 (ref 5.0–8.0)

## 2016-12-14 NOTE — Progress Notes (Signed)
NEW OB HISTORY AND PHYSICAL  SUBJECTIVE:       Kim Conner is a 41 y.o. G1P0 female, Patient's last menstrual period was 09/19/2016., Estimated Date of Delivery: 06/26/17, [redacted]w[redacted]d, presents today for establishment of Prenatal Care. She has no unusual complaints.    Gynecologic History Patient's last menstrual period was 09/19/2016. Normal Contraception: none Last Pap:5/208. Results were: abnormal  Obstetric History OB History  Gravida Para Term Preterm AB Living  1            SAB TAB Ectopic Multiple Live Births               # Outcome Date GA Lbr Len/2nd Weight Sex Delivery Anes PTL Lv  1 Current               No past medical history on file.  Past Surgical History:  Procedure Laterality Date  . none      Current Outpatient Prescriptions on File Prior to Visit  Medication Sig Dispense Refill  . Prenatal Vit-Fe Fumarate-FA (PRENATAL MULTIVITAMIN) TABS tablet Take 1 tablet by mouth daily at 12 noon.     No current facility-administered medications on file prior to visit.     Allergies  Allergen Reactions  . Nitrofurantoin Nausea And Vomiting and Rash    Social History   Social History  . Marital status: Married    Spouse name: N/A  . Number of children: N/A  . Years of education: N/A   Occupational History  . Not on file.   Social History Main Topics  . Smoking status: Never Smoker  . Smokeless tobacco: Never Used  . Alcohol use No     Comment: on occ before pregnant  . Drug use: No  . Sexual activity: Yes    Partners: Male   Other Topics Concern  . Not on file   Social History Narrative  . No narrative on file    Family History  Problem Relation Age of Onset  . Hyperlipidemia Mother   . Hypertension Mother   . Migraines Mother   . Cancer Father        prostate  . Hypertension Father     The following portions of the patient's history were reviewed and updated as appropriate: allergies, current medications, past OB history, past  medical history, past surgical history, past family history, past social history, and problem list.    OBJECTIVE: Initial Physical Exam (New OB)  GENERAL APPEARANCE: alert, well appearing, in no apparent distress, oriented to person, place and time HEAD: normocephalic, atraumatic MOUTH: mucous membranes moist, pharynx normal without lesions THYROID: no thyromegaly or masses present BREASTS: no masses noted, no significant tenderness, no palpable axillary nodes, no skin changes LUNGS: clear to auscultation, no wheezes, rales or rhonchi, symmetric air entry HEART: regular rate and rhythm, no murmurs ABDOMEN: soft, nontender, nondistended, no abnormal masses, no epigastric pain EXTREMITIES: no redness or tenderness in the calves or thighs SKIN: normal coloration and turgor, no rashes LYMPH NODES: no adenopathy palpable NEUROLOGIC: alert, oriented, normal speech, no focal findings or movement disorder noted  PELVIC EXAM EXTERNAL GENITALIA: normal appearing vulva with no masses, tenderness or lesions VAGINA: no abnormal discharge or lesions CERVIX: no cervical motion tenderness UTERUS: gravid ADNEXA: no masses palpable and nontender OB EXAM PELVIMETRY: appears adequate RECTUM: exam not indicated  ASSESSMENT: Normal pregnancy  PLAN: New OB counseling: The patient has been given an overview regarding routine prenatal care. Recommendations regarding diet, weight gain, and exercise in pregnancy  were given. Prenatal testing, optional genetic testing, and ultrasound use in pregnancy were reviewed. Benefits of Breast Feeding were discussed. The patient is encouraged to consider nursing her baby post partum. Follow up in 4 wks.   Philip Aspen, CNM

## 2016-12-14 NOTE — Patient Instructions (Signed)

## 2016-12-15 LAB — CBC WITH DIFFERENTIAL/PLATELET
Basophils Absolute: 0 10*3/uL (ref 0.0–0.2)
Basos: 0 %
EOS (ABSOLUTE): 0.2 10*3/uL (ref 0.0–0.4)
EOS: 1 %
HEMATOCRIT: 40.4 % (ref 34.0–46.6)
Hemoglobin: 13.6 g/dL (ref 11.1–15.9)
IMMATURE GRANULOCYTES: 0 %
Immature Grans (Abs): 0 10*3/uL (ref 0.0–0.1)
Lymphocytes Absolute: 2.6 10*3/uL (ref 0.7–3.1)
Lymphs: 18 %
MCH: 29.6 pg (ref 26.6–33.0)
MCHC: 33.7 g/dL (ref 31.5–35.7)
MCV: 88 fL (ref 79–97)
MONOS ABS: 0.8 10*3/uL (ref 0.1–0.9)
Monocytes: 6 %
NEUTROS PCT: 75 %
Neutrophils Absolute: 10.2 10*3/uL — ABNORMAL HIGH (ref 1.4–7.0)
Platelets: 393 10*3/uL — ABNORMAL HIGH (ref 150–379)
RBC: 4.59 x10E6/uL (ref 3.77–5.28)
RDW: 14.2 % (ref 12.3–15.4)
WBC: 13.8 10*3/uL — AB (ref 3.4–10.8)

## 2016-12-15 LAB — RUBELLA SCREEN: RUBELLA: 2.3 {index} (ref >0.99)

## 2016-12-15 LAB — RPR: RPR Ser Ql: NONREACTIVE

## 2016-12-15 LAB — RH TYPE: Rh Factor: POSITIVE

## 2016-12-15 LAB — VARICELLA ZOSTER ANTIBODY, IGG: VARICELLA: 1202 {index} (ref >165)

## 2016-12-15 LAB — HEPATITIS B SURFACE ANTIGEN: HEP B S AG: NEGATIVE

## 2016-12-15 LAB — ABO

## 2016-12-15 LAB — HIV ANTIBODY (ROUTINE TESTING W REFLEX): HIV SCREEN 4TH GENERATION: NONREACTIVE

## 2016-12-15 LAB — ANTIBODY SCREEN: Antibody Screen: NEGATIVE

## 2016-12-16 LAB — PAP IG AND HPV HIGH-RISK
HPV, HIGH-RISK: NEGATIVE
PAP Smear Comment: 0

## 2016-12-17 ENCOUNTER — Encounter: Payer: Self-pay | Admitting: Certified Nurse Midwife

## 2017-01-12 ENCOUNTER — Encounter: Payer: Self-pay | Admitting: Obstetrics and Gynecology

## 2017-01-12 ENCOUNTER — Ambulatory Visit (INDEPENDENT_AMBULATORY_CARE_PROVIDER_SITE_OTHER): Payer: BLUE CROSS/BLUE SHIELD | Admitting: Obstetrics and Gynecology

## 2017-01-12 VITALS — BP 137/75 | HR 82 | Wt 126.2 lb

## 2017-01-12 DIAGNOSIS — Z23 Encounter for immunization: Secondary | ICD-10-CM | POA: Diagnosis not present

## 2017-01-12 DIAGNOSIS — Z3492 Encounter for supervision of normal pregnancy, unspecified, second trimester: Secondary | ICD-10-CM

## 2017-01-12 NOTE — Progress Notes (Signed)
[name]- pt is doing well, flu vaccine given 

## 2017-01-12 NOTE — Patient Instructions (Signed)
Second Trimester of Pregnancy The second trimester is from week 13 through week 28, month 4 through 6. This is often the time in pregnancy that you feel your best. Often times, morning sickness has lessened or quit. You may have more energy, and you may get hungry more often. Your unborn baby (fetus) is growing rapidly. At the end of the sixth month, he or she is about 9 inches long and weighs about 1 pounds. You will likely feel the baby move (quickening) between 18 and 20 weeks of pregnancy. Follow these instructions at home:  Avoid all smoking, herbs, and alcohol. Avoid drugs not approved by your doctor.  Do not use any tobacco products, including cigarettes, chewing tobacco, and electronic cigarettes. If you need help quitting, ask your doctor. You may get counseling or other support to help you quit.  Only take medicine as told by your doctor. Some medicines are safe and some are not during pregnancy.  Exercise only as told by your doctor. Stop exercising if you start having cramps.  Eat regular, healthy meals.  Wear a good support bra if your breasts are tender.  Do not use hot tubs, steam rooms, or saunas.  Wear your seat belt when driving.  Avoid raw meat, uncooked cheese, and liter boxes and soil used by cats.  Take your prenatal vitamins.  Take 1500-2000 milligrams of calcium daily starting at the 20th week of pregnancy until you deliver your baby.  Try taking medicine that helps you poop (stool softener) as needed, and if your doctor approves. Eat more fiber by eating fresh fruit, vegetables, and whole grains. Drink enough fluids to keep your pee (urine) clear or pale yellow.  Take warm water baths (sitz baths) to soothe pain or discomfort caused by hemorrhoids. Use hemorrhoid cream if your doctor approves.  If you have puffy, bulging veins (varicose veins), wear support hose. Raise (elevate) your feet for 15 minutes, 3-4 times a day. Limit salt in your diet.  Avoid heavy  lifting, wear low heals, and sit up straight.  Rest with your legs raised if you have leg cramps or low back pain.  Visit your dentist if you have not gone during your pregnancy. Use a soft toothbrush to brush your teeth. Be gentle when you floss.  You can have sex (intercourse) unless your doctor tells you not to.  Go to your doctor visits. Get help if:  You feel dizzy.  You have mild cramps or pressure in your lower belly (abdomen).  You have a nagging pain in your belly area.  You continue to feel sick to your stomach (nauseous), throw up (vomit), or have watery poop (diarrhea).  You have bad smelling fluid coming from your vagina.  You have pain with peeing (urination). Get help right away if:  You have a fever.  You are leaking fluid from your vagina.  You have spotting or bleeding from your vagina.  You have severe belly cramping or pain.  You lose or gain weight rapidly.  You have trouble catching your breath and have chest pain.  You notice sudden or extreme puffiness (swelling) of your face, hands, ankles, feet, or legs.  You have not felt the baby move in over an hour.  You have severe headaches that do not go away with medicine.  You have vision changes. This information is not intended to replace advice given to you by your health care provider. Make sure you discuss any questions you have with your health care   provider. Document Released: 05/19/2009 Document Revised: 07/31/2015 Document Reviewed: 04/25/2012 Elsevier Interactive Patient Education  2017 [employer]  

## 2017-01-12 NOTE — Progress Notes (Signed)
[name]- doing well flu vaccine given today, anatomy scan next visit. Nausea gone.

## 2017-01-19 ENCOUNTER — Other Ambulatory Visit: Payer: Self-pay | Admitting: Obstetrics and Gynecology

## 2017-01-19 DIAGNOSIS — Z369 Encounter for antenatal screening, unspecified: Secondary | ICD-10-CM

## 2017-02-01 ENCOUNTER — Encounter: Payer: Self-pay | Admitting: Certified Nurse Midwife

## 2017-02-01 ENCOUNTER — Ambulatory Visit (INDEPENDENT_AMBULATORY_CARE_PROVIDER_SITE_OTHER): Payer: BLUE CROSS/BLUE SHIELD | Admitting: Certified Nurse Midwife

## 2017-02-01 ENCOUNTER — Other Ambulatory Visit: Payer: Self-pay

## 2017-02-01 ENCOUNTER — Ambulatory Visit (INDEPENDENT_AMBULATORY_CARE_PROVIDER_SITE_OTHER): Payer: BLUE CROSS/BLUE SHIELD

## 2017-02-01 VITALS — BP 122/81 | HR 74 | Wt 130.8 lb

## 2017-02-01 DIAGNOSIS — Z369 Encounter for antenatal screening, unspecified: Secondary | ICD-10-CM

## 2017-02-01 DIAGNOSIS — Z3492 Encounter for supervision of normal pregnancy, unspecified, second trimester: Secondary | ICD-10-CM

## 2017-02-01 LAB — POCT URINALYSIS DIPSTICK
Bilirubin, UA: NEGATIVE
GLUCOSE UA: NEGATIVE
Ketones, UA: NEGATIVE
Leukocytes, UA: NEGATIVE
Nitrite, UA: NEGATIVE
PH UA: 6.5 (ref 5.0–8.0)
Protein, UA: NEGATIVE
RBC UA: NEGATIVE
SPEC GRAV UA: 1.02 (ref 1.010–1.025)
UROBILINOGEN UA: 0.2 U/dL

## 2017-02-01 NOTE — Progress Notes (Signed)
[name],  Doing well . No complaints. Reviewed 20 anatomy u/s results. Currently she has a complete previa. Reviewed diagnosis, answered her questions. Encourged her to sustain from intercourse until repeat u/s. She agrees to plan. Bleeding precautions reviewed. Follow up in 4 wks.   Philip Aspen, CNM    ULTRASOUND REPORT  Location: [facility] Date of Service:  02/01/17  Indications: Anatomy Findings:  Singleton intrauterine pregnancy is visualized with FHR at 149 BPM. Biometrics give an (U/S) Gestational age of [redacted] weeks and an (U/S) EDD of 06/21/17; this correlates with the clinically established EDD of 06/26/17.  Fetal presentation is vertex.  EFW: 339 grams (0lb 12oz). Placenta: Posterior and grade 1.  Complete previa. AFI: WNL subjectively.  Anatomic survey is complete and appears WNL; Gender - Female.   Right Ovary was not visualized due to overlying bowel gas. Left Ovary measures 2.3 x 2.0 x 1.9 cm and appears WNL  There is no obvious evidence of a corpus luteal cyst. Survey of the adnexa demonstrates no adnexal masses. There is no free peritoneal fluid in the cul de sac.  Impression: 1. 22 week Viable Singleton Intrauterine pregnancy by U/S. 2. (U/S) EDD is consistent with Clinically established (LMP) EDD of 06/26/17. 3. Complete anatomy scan. 4. Complete placenta previa.  Recommendations: 1.Clinical correlation with the patient's History and Physical Exam. 2. F/U U/S recommended at 28 weeks to reassess placenta location.

## 2017-02-01 NOTE — Patient Instructions (Addendum)
Round Ligament Pain The round ligament is a cord of muscle and tissue that helps to support the uterus. It can become a source of pain during pregnancy if it becomes stretched or twisted as the baby grows. The pain usually begins in the second trimester of pregnancy, and it can come and go until the baby is delivered. It is not a serious problem, and it does not cause harm to the baby. Round ligament pain is usually a short, sharp, and pinching pain, but it can also be a dull, lingering, and aching pain. The pain is felt in the lower side of the abdomen or in the groin. It usually starts deep in the groin and moves up to the outside of the hip area. Pain can occur with:  A sudden change in position.  Rolling over in bed.  Coughing or sneezing.  Physical activity.  Follow these instructions at home: Watch your condition for any changes. Take these steps to help with your pain:  When the pain starts, relax. Then try: ? Sitting down. ? Flexing your knees up to your abdomen. ? Lying on your side with one pillow under your abdomen and another pillow between your legs. ? Sitting in a warm bath for 15-20 minutes or until the pain goes away.  Take over-the-counter and prescription medicines only as told by your health care provider.  Move slowly when you sit and stand.  Avoid long walks if they cause pain.  Stop or lessen your physical activities if they cause pain.  Contact a health care provider if:  Your pain does not go away with treatment.  You feel pain in your back that you did not have before.  Your medicine is not helping. Get help right away if:  You develop a fever or chills.  You develop uterine contractions.  You develop vaginal bleeding.  You develop nausea or vomiting.  You develop diarrhea.  You have pain when you urinate. This information is not intended to replace advice given to you by your health care provider. Make sure you discuss any questions you have  with your health care provider. Document Released: 12/02/2007 Document Revised: 07/31/2015 Document Reviewed: 05/01/2014 Elsevier Interactive Patient Education  2018 [employer].  Placenta Previa Placenta previa is a condition in which the placenta implants in the lower part of the uterus in pregnant women. The placenta either partially or completely covers the opening to the cervix. This is a problem because the baby must pass through the cervix during delivery. There are three types of placenta previa: Marginal placenta previa. The placenta reaches within an inch (2.5 cm) of the cervical opening but does not cover it. Partial placenta previa. The placenta covers part of the cervical opening. Complete placenta previa. The placenta covers the entire cervical opening.  If the previa is marginal or partial and it is diagnosed in the first half of pregnancy, the placenta may move into a normal position as the pregnancy progresses and may no longer cover the cervix. It is important to keep all prenatal visits with your health care provider so you can be more closely monitored. What are the causes? The cause of this condition is not known. What increases the risk? This condition is more likely to develop in women who: Are carrying more than one baby (multiples). Have an abnormally shaped uterus. Have scars on the lining of the uterus. Have had surgeries involving the uterus, such as a cesarean delivery. Have delivered a baby before. Have  a history of placenta previa. Have smoked or used cocaine during pregnancy. Are age 51 or older during pregnancy.  What are the signs or symptoms? The main symptom of this condition is sudden, painless vaginal bleeding during the second half of pregnancy. The amount of bleeding can be very light at first, and it usually stops on its own. Heavier bleeding episodes may also happen. Some women with placenta previa may have no bleeding at all. How is this  diagnosed? This condition is diagnosed: From an ultrasound. This test uses sound waves to find where the placenta is located before you have any bleeding episodes. During a checkup after vaginal bleeding is noticed. If you are diagnosed with a partial or complete previa, digital exams with fingers will generally be avoided. Your health care provider will still perform a speculum exam. If you did not have an ultrasound during your pregnancy, placenta previa may not be diagnosed until bleeding occurs during labor. How is this treated? Treatment for this condition may include: Decreased activity. Bed rest at home or in the hospital. Pelvic rest. Nothing is placed inside the vagina during pelvic rest. This means not having sex and not using tampons or douches. A blood transfusion to replace blood that you have lost (maternal blood loss). A cesarean delivery. This may be performed if: The bleeding is heavy and cannot be controlled. The placenta completely covers the cervix. Medicines to stop premature labor or to help the baby's lungs to mature. This treatment may be used if you need delivery before your pregnancy is full-term.  Your treatment will be decided based on: How much you are bleeding, or whether the bleeding has stopped. How far along you are in your pregnancy. The condition of your baby. The type of placenta previa that you have.  Follow these instructions at home: Get plenty of rest and lessen activity as told by your health care provider. Stay on bed rest for as long as told by your health care provider. Do not have sex, use tampons, use a douche, or place anything inside of your vagina if your health care provider recommended pelvic rest. Take over-the-counter and prescription medicines as told by your health care provider. Keep all follow-up visits as told by your health care provider. This is important. Get help right away if: You have vaginal bleeding, even if in small  amounts and even if you have no pain. You have cramping or regular contractions. You have pain in your abdomen or your lower back. You have a feeling of increased pressure in your pelvis. You have increased watery or bloody mucus from the vagina. This information is not intended to replace advice given to you by your health care provider. Make sure you discuss any questions you have with your health care provider. Document Released: 02/22/2005 Document Revised: 11/12/2015 Document Reviewed: 09/06/2015 Elsevier Interactive Patient Education  [employer].

## 2017-03-08 NOTE — L&D Delivery Note (Signed)
Delivery Note At 1450 a viable, healthy and but floppy female "Shanon Brow"  was delivered via  (Presentation: ; LOP ).  APGAR: 7,8   .   Placenta status:delivered intact with 3 vessel  Cord:  with the following complications: NCx1 reduced on perineum, prolonged second stage  Anesthesia:  epidural Episiotomy:  midline Lacerations:  none Suture Repair: 3.0 vicryl rapide Est. Blood Loss (mL):  350  Mom to postpartum.  Baby to Nursery.for initial transition observation.  Anabia Weatherwax N Ziona Wickens 06/15/2017, 3:16 PM

## 2017-03-09 ENCOUNTER — Ambulatory Visit (INDEPENDENT_AMBULATORY_CARE_PROVIDER_SITE_OTHER): Payer: BLUE CROSS/BLUE SHIELD | Admitting: Obstetrics and Gynecology

## 2017-03-09 VITALS — BP 128/74 | HR 98 | Wt 136.7 lb

## 2017-03-09 DIAGNOSIS — Z3492 Encounter for supervision of normal pregnancy, unspecified, second trimester: Secondary | ICD-10-CM

## 2017-03-09 LAB — POCT URINALYSIS DIPSTICK
BILIRUBIN UA: NEGATIVE
GLUCOSE UA: NEGATIVE
KETONES UA: NEGATIVE
Leukocytes, UA: NEGATIVE
Nitrite, UA: NEGATIVE
Protein, UA: NEGATIVE
RBC UA: NEGATIVE
SPEC GRAV UA: 1.01 (ref 1.010–1.025)
Urobilinogen, UA: 0.2 E.U./dL
pH, UA: 6 (ref 5.0–8.0)

## 2017-03-09 MED ORDER — BREAST PUMP MISC: 0 refills | Status: DC

## 2017-03-09 NOTE — Progress Notes (Signed)
[name]- pt is doing well, having some back pain

## 2017-03-09 NOTE — Progress Notes (Signed)
[name]-not sleeping well, recommend Tylenol PM; denies spotting or concerns. Encouraged to enroll in classes.

## 2017-03-15 ENCOUNTER — Other Ambulatory Visit: Payer: Self-pay | Admitting: Obstetrics and Gynecology

## 2017-03-15 DIAGNOSIS — IMO0002 Reserved for concepts with insufficient information to code with codable children: Secondary | ICD-10-CM

## 2017-03-15 DIAGNOSIS — Z0489 Encounter for examination and observation for other specified reasons: Secondary | ICD-10-CM

## 2017-04-07 ENCOUNTER — Ambulatory Visit (INDEPENDENT_AMBULATORY_CARE_PROVIDER_SITE_OTHER): Payer: BLUE CROSS/BLUE SHIELD

## 2017-04-07 ENCOUNTER — Ambulatory Visit (INDEPENDENT_AMBULATORY_CARE_PROVIDER_SITE_OTHER): Payer: BLUE CROSS/BLUE SHIELD | Admitting: Certified Nurse Midwife

## 2017-04-07 ENCOUNTER — Other Ambulatory Visit: Payer: BLUE CROSS/BLUE SHIELD

## 2017-04-07 VITALS — BP 133/82 | HR 90 | Wt 139.4 lb

## 2017-04-07 DIAGNOSIS — Z23 Encounter for immunization: Secondary | ICD-10-CM | POA: Diagnosis not present

## 2017-04-07 DIAGNOSIS — Z3493 Encounter for supervision of normal pregnancy, unspecified, third trimester: Secondary | ICD-10-CM

## 2017-04-07 DIAGNOSIS — IMO0002 Reserved for concepts with insufficient information to code with codable children: Secondary | ICD-10-CM

## 2017-04-07 DIAGNOSIS — Z0489 Encounter for examination and observation for other specified reasons: Secondary | ICD-10-CM

## 2017-04-07 LAB — POCT URINALYSIS DIPSTICK
Bilirubin, UA: NEGATIVE
Ketones, UA: NEGATIVE
LEUKOCYTES UA: NEGATIVE
NITRITE UA: NEGATIVE
PROTEIN UA: NEGATIVE
RBC UA: NEGATIVE
Spec Grav, UA: >=1.03 — AB (ref 1.010–1.025)
Urobilinogen, UA: 0.2 E.U./dL
pH, UA: 6.5 (ref 5.0–8.0)

## 2017-04-07 MED ORDER — TETANUS-DIPHTH-ACELL PERTUSSIS 5-2.5-18.5 LF-MCG/0.5 IM SUSP
0.5000 mL | Freq: Once | INTRAMUSCULAR | Status: AC
  Administered 2017-04-07: 0.5 mL via INTRAMUSCULAR

## 2017-04-07 NOTE — Patient Instructions (Addendum)
Abdominal Pain During Pregnancy Belly (abdominal) pain is common during pregnancy. Most of the time, it is not a serious problem. Other times, it can be a sign that something is wrong with the pregnancy. Always tell your doctor if you have belly pain. Follow these instructions at home: Monitor your belly pain for any changes. The following actions may help you feel better:  Do not have sex (intercourse) or put anything in your vagina until you feel better.  Rest until your pain stops.  Drink clear fluids if you feel sick to your stomach (nauseous). Do not eat solid food until you feel better.  Only take medicine as told by your doctor.  Keep all doctor visits as told.  Get help right away if:  You are bleeding, leaking fluid, or pieces of tissue come out of your vagina.  You have more pain or cramping.  You keep throwing up (vomiting).  You have pain when you pee (urinate) or have blood in your pee.  You have a fever.  You do not feel your baby moving as much.  You feel very weak or feel like passing out.  You have trouble breathing, with or without belly pain.  You have a very bad headache and belly pain.  You have fluid leaking from your vagina and belly pain.  You keep having watery poop (diarrhea).  Your belly pain does not go away after resting, or the pain gets worse. This information is not intended to replace advice given to you by your health care provider. Make sure you discuss any questions you have with your health care provider. Document Released: 02/10/2009 Document Revised: 10/01/2015 Document Reviewed: 09/21/2012 Elsevier Interactive Patient Education  2018 [employer]. Back Pain in Pregnancy Back pain during pregnancy is common. Back pain may be caused by several factors that are related to changes during your pregnancy. Follow these instructions at home: Managing pain, stiffness, and swelling  If directed, apply ice for sudden (acute) back  pain. ? Put ice in a plastic bag. ? Place a towel between your skin and the bag. ? Leave the ice on for 20 minutes, 2-3 times per day.  If directed, apply heat to the affected area before you exercise: ? Place a towel between your skin and the heat pack or heating pad. ? Leave the heat on for 20-30 minutes. ? Remove the heat if your skin turns bright red. This is especially important if you are unable to feel pain, heat, or cold. You may have a greater risk of getting burned. Activity  Exercise as told by your health care provider. Exercising is the best way to prevent or manage back pain.  Listen to your body when lifting. If lifting hurts, ask for help or bend your knees. This uses your leg muscles instead of your back muscles.  Squat down when picking up something from the floor. Do not bend over.  Only use bed rest as told by your health care provider. Bed rest should only be used for the most severe episodes of back pain. Standing, Sitting, and Lying Down  Do not stand in one place for long periods of time.  Use good posture when sitting. Make sure your head rests over your shoulders and is not hanging forward. Use a pillow on your lower back if necessary.  Try sleeping on your side, preferably the left side, with a pillow or two between your legs. If you are sore after a night's rest, your bed may  be too soft. A firm mattress may provide more support for your back during pregnancy. General instructions  Do not wear high heels.  Eat a healthy diet. Try to gain weight within your health care provider's recommendations.  Use a maternity girdle, elastic sling, or back brace as told by your health care provider.  Take over-the-counter and prescription medicines only as told by your health care provider.  Keep all follow-up visits as told by your health care provider. This is important. This includes any visits with any specialists, such as a physical therapist. Contact a health  care provider if:  Your back pain interferes with your daily activities.  You have increasing pain in other parts of your body. Get help right away if:  You develop numbness, tingling, weakness, or problems with the use of your arms or legs.  You develop severe back pain that is not controlled with medicine.  You have a sudden change in bowel or bladder control.  You develop shortness of breath, dizziness, or you faint.  You develop nausea, vomiting, or sweating.  You have back pain that is a rhythmic, cramping pain similar to labor pains. Labor pain is usually 1-2 minutes apart, lasts for about 1 minute, and involves a bearing down feeling or pressure in your pelvis.  You have back pain and your water breaks or you have vaginal bleeding.  You have back pain or numbness that travels down your leg.  Your back pain developed after you fell.  You develop pain on one side of your back.  You see blood in your urine.  You develop skin blisters in the area of your back pain. This information is not intended to replace advice given to you by your health care provider. Make sure you discuss any questions you have with your health care provider. Document Released: 06/02/2005 Document Revised: 07/31/2015 Document Reviewed: 11/06/2014 Elsevier Interactive Patient Education  2018 [employer]. Fetal Movement Counts Patient Name: ________________________________________________ Patient Due Date: ____________________ What is a fetal movement count? A fetal movement count is the number of times that you feel your baby move during a certain amount of time. This may also be called a fetal kick count. A fetal movement count is recommended for every pregnant woman. You may be asked to start counting fetal movements as early as week 28 of your pregnancy. Pay attention to when your baby is most active. You may notice your baby's sleep and wake cycles. You may also notice things that make your baby  move more. You should do a fetal movement count:  When your baby is normally most active.  At the same time each day.  A good time to count movements is while you are resting, after having something to eat and drink. How do I count fetal movements? 1. Find a quiet, comfortable area. Sit, or lie down on your side. 2. Write down the date, the start time and stop time, and the number of movements that you felt between those two times. Take this information with you to your health care visits. 3. For 2 hours, count kicks, flutters, swishes, rolls, and jabs. You should feel at least 10 movements during 2 hours. 4. You may stop counting after you have felt 10 movements. 5. If you do not feel 10 movements in 2 hours, have something to eat and drink. Then, keep resting and counting for 1 hour. If you feel at least 4 movements during that hour, you may stop counting. Contact a  health care provider if:  You feel fewer than 4 movements in 2 hours.  Your baby is not moving like he or she usually does. Date: ____________ Start time: ____________ Stop time: ____________ Movements: ____________ Date: ____________ Start time: ____________ Stop time: ____________ Movements: ____________ Date: ____________ Start time: ____________ Stop time: ____________ Movements: ____________ Date: ____________ Start time: ____________ Stop time: ____________ Movements: ____________ Date: ____________ Start time: ____________ Stop time: ____________ Movements: ____________ Date: ____________ Start time: ____________ Stop time: ____________ Movements: ____________ Date: ____________ Start time: ____________ Stop time: ____________ Movements: ____________ Date: ____________ Start time: ____________ Stop time: ____________ Movements: ____________ Date: ____________ Start time: ____________ Stop time: ____________ Movements: ____________ This information is not intended to replace advice given to you by your health care  provider. Make sure you discuss any questions you have with your health care provider. Document Released: 03/24/2006 Document Revised: 10/22/2015 Document Reviewed: 04/03/2015 Elsevier Interactive Patient Education  2018 [employer]. Cord Blood Banking Information Cord blood banking is the process of collecting and storing the blood that is in the umbilical cord and placenta at the time of delivery. This blood contains stem cells, which can be used to treat many blood diseases, immune system disorders, and childhood cancers. Stem cells can also be used to research certain diseases and treatments. Many people who choose cord blood banking donate the blood. Donated blood can be used in lifesaving treatments or for research. Other people choose to store the blood privately. Blood that is stored privately can only be used with the person's permission. This option is often chosen if:  A family member needs a stem cell transplant.  The child is part of an ethnic minority.  The child was conceived through in vitro fertilization.  What should I look for in a blood bank? A blood bank is the organization that coordinates cord blood banking. Make sure the cord blood bank that you use:  Is accredited.  Is financially stable.  Handles a large volume of cord blood samples.  Has a procedure in place for transport and storage.  Allows you the option of transferring your cord blood sample.  Has a procedure in place if the bank goes out of business.  Clearly states all costs and limits to future costs.  People who choose to donate cord blood should not need to pay for blood banking. People who keep the blood for private use will need to pay for the first (initial) storage and pay a fee each year (annual fee). Other fees may also apply. What are the risks of cord blood banking? There are no health risks associated with cord blood banking. It is considered safe. How should I prepare? You must  schedule this process at least 4-6 weeks before you will be giving birth. How is the blood collected? The blood is collected as soon as the baby has been delivered. Within 15 minutes of delivery, a health care provider will take these actions to collect the blood:  Clamp the umbilical cord at the top and bottom. This traps the blood in the umbilical cord.  Use a syringe or bag to collect the blood.  Insert needles into the placenta to collect (draw out) more blood.  What happens after the blood is collected? After the blood has been collected:  The blood will be sent to a blood bank.  The blood will be tested for genetic problems and infectious diseases. If the blood tests positive for a genetic problem or a disease, someone  will contact you and let you know.  The blood will be frozen.  If your child develops a genetic condition, immune system disorder, or cancer, you will be responsible for contacting the blood bank and letting them know. This information is not intended to replace advice given to you by your health care provider. Make sure you discuss any questions you have with your health care provider. Document Released: 08/12/2009 Document Revised: 07/31/2015 Document Reviewed: 08/12/2014 Elsevier Interactive Patient Education  2018 [employer]. Common Medications Safe in Pregnancy  Acne:      Constipation:  Benzoyl Peroxide     Colace  Clindamycin      Dulcolax Suppository  Topica Erythromycin     Fibercon  Salicylic Acid      Metamucil         Miralax AVOID:        Senakot   Accutane    Cough:  Retin-A       Cough Drops  Tetracycline      Phenergan w/ Codeine if Rx  Minocycline      Robitussin (Plain & DM)  Antibiotics:     Crabs/Lice:  Ceclor       RID  Cephalosporins    AVOID:  E-Mycins      Kwell  Keflex  Macrobid/Macrodantin   Diarrhea:  Penicillin      Kao-Pectate  Zithromax      Imodium AD         PUSH  FLUIDS AVOID:       Cipro     Fever:  Tetracycline      Tylenol (Regular or Extra  Minocycline       Strength)  Levaquin      Extra Strength-Do not          Exceed 8 tabs/24 hrs Caffeine:        <218m/day (equiv. To 1 cup of coffee or  approx. 3 12 oz sodas)         Gas: Cold/Hayfever:       Gas-X  Benadryl      Mylicon  Claritin       Phazyme  **Claritin-D        Chlor-Trimeton    Headaches:  Dimetapp      ASA-Free Excedrin  Drixoral-Non-Drowsy     Cold Compress  Mucinex (Guaifenasin)     Tylenol (Regular or Extra  Sudafed/Sudafed-12 Hour     Strength)  **Sudafed PE Pseudoephedrine   Tylenol Cold & Sinus     Vicks Vapor Rub  Zyrtec  **AVOID if Problems With Blood Pressure         Heartburn: Avoid lying down for at least 1 hour after meals  Aciphex      Maalox     Rash:  Milk of Magnesia     Benadryl    Mylanta       1% Hydrocortisone Cream  Pepcid  Pepcid Complete   Sleep Aids:  Prevacid      Ambien   Prilosec       Benadryl  Rolaids       Chamomile Tea  Tums (Limit 4/day)     Unisom  Zantac       Tylenol PM         Warm milk-add vanilla or  Hemorrhoids:       Sugar for taste  Anusol/Anusol H.C.  (RX: Analapram 2.5%)  Sugar Substitutes:  Hydrocortisone OTC     Ok in moderation  Preparation H  Tucks        Vaseline lotion applied to tissue with wiping    Herpes:     Throat:  Acyclovir      Oragel  Famvir  Valtrex     Vaccines:         Flu Shot Leg Cramps:       *Gardasil  Benadryl      Hepatitis A         Hepatitis B Nasal Spray:       Pneumovax  Saline Nasal Spray     Polio Booster         Tetanus Nausea:       Tuberculosis test or PPD  Vitamin B6 25 mg TID   AVOID:    Dramamine      *Gardasil  Emetrol       Live Poliovirus  Ginger Root 250 mg QID    MMR (measles, mumps &  High Complex Carbs @ Bedtime    rebella)  Sea Bands-Accupressure    Varicella (Chickenpox)  Unisom 1/2 tab TID     *No known complications           If received  before Pain:         Known pregnancy;   Darvocet       Resume series after  Lortab        Delivery  Percocet    Yeast:   Tramadol      Femstat  Tylenol 3      Gyne-lotrimin  Ultram       Monistat  Vicodin           MISC:         All Sunscreens           Hair Coloring/highlights          Insect Repellant's          (Including DEET)         Mystic Tans Pain Relief During Labor and Delivery Many things can cause pain during labor and delivery, including:  Pressure on bones and ligaments due to the baby moving through the pelvis.  Stretching of tissues due to the baby moving through the birth canal.  Muscle tension due to anxiety or nervousness.  The uterus tightening (contracting) and relaxing to help move the baby.  There are many ways to deal with the pain of labor and delivery. They include:  Taking prenatal classes. Taking these classes helps you know what to expect during your baby's birth. What you learn will increase your confidence and decrease your anxiety.  Practicing relaxation techniques or doing relaxing activities, such as: ? Focused breathing. ? Meditation. ? Visualization. ? Aroma therapy. ? Listening to your favorite music. ? Hypnosis.  Taking a warm shower or bath (hydrotherapy). This may: ? Provide comfort and relaxation. ? Lessen your perception of pain. ? Decrease the amount of pain medicine needed. ? Decrease the length of labor.  Getting a massage or counterpressure on your back.  Applying warm packs or ice packs.  Changing positions often, moving around, or using a birthing ball.  Getting: ? Pain medicine through an IV or injection into a muscle. ? Pain medicine inserted into your spinal column. ? Injections of sterile water just under the skin on your lower back (intradermal injections). ? Laughing gas (nitrous oxide).  Discuss your pain control options with your health care provider during your prenatal visits. Explore the options offered  by your hospital or birth  center. What kinds of medicine are available? There are two kinds of medicines that can be used to relieve pain during labor and delivery:  Analgesics. These medicines decrease pain without causing you to lose feeling or the ability to move your muscles.  Anesthetics. These medicines block feeling in the body and can decrease your ability to move freely.  Both of these kinds of medicine can cause minor side effects, such as nausea, trouble concentrating, and sleepiness. They can also decrease the baby's heart rate before birth and affect the baby's breathing rate after birth. For this reason, health care providers are careful about when and how much medicine is given. What are specific medicines and procedures that provide pain relief? Local Anesthetics Local anesthetics are used to numb a small area of the body. They may be used along with another kind of anesthetic or used to numb the nerves of the vagina, cervix, and perineum during the second stage of labor. General Anesthetics General anesthetics cause you to lose consciousness so you do not feel pain. They are usually only used for an emergency cesarean delivery. General anesthetics are given through an IV tube and a mask. Pudendal Block A pudendal block is a form of local anesthetic. It may be used to relieve the pain associated with pushing or stretching of the perineum at the time of delivery or to further numb the perineum. A pudendal block is done by injecting numbing medicine through the vaginal wall into a nerve in the pelvis. Epidural Analgesia Epidural analgesia is given through a flexible IV catheter that is inserted into the lower back. Numbing medicine is delivered continuously to the area near your spinal column nerves (epidural space). After having this type of analgesia, you may be able to move your legs but you most likely will not be able to walk. Depending on the amount of medicine given, you may  lose all feeling in the lower half of your body, or you may retain some level of sensation, including the urge to push. Epidural analgesia can be used to provide pain relief for a vaginal birth. Spinal Block A spinal block is similar to epidural analgesia, but the medicine is injected into the spinal fluid instead of the epidural space. A spinal block is only given once. It starts to relieve pain quickly, but the pain relief lasts only 1-6 hours. Spinal blocks can be used for cesarean deliveries. Combined Spinal-Epidural (CSE) Block A CSE block combines the effects of a spinal block and epidural analgesia. The spinal block works quickly to block all pain. The epidural analgesia provides continuous pain relief, even after the effects of the spinal block have worn off. This information is not intended to replace advice given to you by your health care provider. Make sure you discuss any questions you have with your health care provider. Document Released: 06/10/2008 Document Revised: 08/01/2015 Document Reviewed: 07/16/2015 Elsevier Interactive Patient Education  2018 [employer] of Pregnancy The third trimester is from week 29 through week 42, months 7 through 9. This trimester is when your unborn baby (fetus) is growing very fast. At the end of the ninth month, the unborn baby is about 20 inches in length. It weighs about 6-10 pounds. Follow these instructions at home:  Avoid all smoking, herbs, and alcohol. Avoid drugs not approved by your doctor.  Do not use any tobacco products, including cigarettes, chewing tobacco, and electronic cigarettes. If you need help quitting, ask your doctor. You may get counseling  or other support to help you quit.  Only take medicine as told by your doctor. Some medicines are safe and some are not during pregnancy.  Exercise only as told by your doctor. Stop exercising if you start having cramps.  Eat regular, healthy meals.  Wear a good  support bra if your breasts are tender.  Do not use hot tubs, steam rooms, or saunas.  Wear your seat belt when driving.  Avoid raw meat, uncooked cheese, and liter boxes and soil used by cats.  Take your prenatal vitamins.  Take 1500-2000 milligrams of calcium daily starting at the 20th week of pregnancy until you deliver your baby.  Try taking medicine that helps you poop (stool softener) as needed, and if your doctor approves. Eat more fiber by eating fresh fruit, vegetables, and whole grains. Drink enough fluids to keep your pee (urine) clear or pale yellow.  Take warm water baths (sitz baths) to soothe pain or discomfort caused by hemorrhoids. Use hemorrhoid cream if your doctor approves.  If you have puffy, bulging veins (varicose veins), wear support hose. Raise (elevate) your feet for 15 minutes, 3-4 times a day. Limit salt in your diet.  Avoid heavy lifting, wear low heels, and sit up straight.  Rest with your legs raised if you have leg cramps or low back pain.  Visit your dentist if you have not gone during your pregnancy. Use a soft toothbrush to brush your teeth. Be gentle when you floss.  You can have sex (intercourse) unless your doctor tells you not to.  Do not travel far distances unless you must. Only do so with your doctor's approval.  Take prenatal classes.  Practice driving to the hospital.  Pack your hospital bag.  Prepare the baby's room.  Go to your doctor visits. Get help if:  You are not sure if you are in labor or if your water has broken.  You are dizzy.  You have mild cramps or pressure in your lower belly (abdominal).  You have a nagging pain in your belly area.  You continue to feel sick to your stomach (nauseous), throw up (vomit), or have watery poop (diarrhea).  You have bad smelling fluid coming from your vagina.  You have pain with peeing (urination). Get help right away if:  You have a fever.  You are leaking fluid from your  vagina.  You are spotting or bleeding from your vagina.  You have severe belly cramping or pain.  You lose or gain weight rapidly.  You have trouble catching your breath and have chest pain.  You notice sudden or extreme puffiness (swelling) of your face, hands, ankles, feet, or legs.  You have not felt the baby move in over an hour.  You have severe headaches that do not go away with medicine.  You have vision changes. This information is not intended to replace advice given to you by your health care provider. Make sure you discuss any questions you have with your health care provider. Document Released: 05/19/2009 Document Revised: 07/31/2015 Document Reviewed: 04/25/2012 Elsevier Interactive Patient Education  2017 [employer]. Breastfeeding Choosing to breastfeed is one of the best decisions you can make for yourself and your baby. A change in hormones during pregnancy causes your breasts to make breast milk in your milk-producing glands. Hormones prevent breast milk from being released before your baby is born. They also prompt milk flow after birth. Once breastfeeding has begun, thoughts of your baby, as well as his  or her sucking or crying, can stimulate the release of milk from your milk-producing glands. Benefits of breastfeeding Research shows that breastfeeding offers many health benefits for infants and mothers. It also offers a cost-free and convenient way to feed your baby. For your baby  Your first milk (colostrum) helps your baby's digestive system to function better.  Special cells in your milk (antibodies) help your baby to fight off infections.  Breastfed babies are less likely to develop asthma, allergies, obesity, or type 2 diabetes. They are also at lower risk for sudden infant death syndrome (SIDS).  Nutrients in breast milk are better able to meet your baby's needs compared to infant formula.  Breast milk improves your baby's brain development. For  you  Breastfeeding helps to create a very special bond between you and your baby.  Breastfeeding is convenient. Breast milk costs nothing and is always available at the correct temperature.  Breastfeeding helps to burn calories. It helps you to lose the weight that you gained during pregnancy.  Breastfeeding makes your uterus return faster to its size before pregnancy. It also slows bleeding (lochia) after you give birth.  Breastfeeding helps to lower your risk of developing type 2 diabetes, osteoporosis, rheumatoid arthritis, cardiovascular disease, and breast, ovarian, uterine, and endometrial cancer later in life. Breastfeeding basics Starting breastfeeding  Find a comfortable place to sit or lie down, with your neck and back well-supported.  Place a pillow or a rolled-up blanket under your baby to bring him or her to the level of your breast (if you are seated). Nursing pillows are specially designed to help support your arms and your baby while you breastfeed.  Make sure that your baby's tummy (abdomen) is facing your abdomen.  Gently massage your breast. With your fingertips, massage from the outer edges of your breast inward toward the nipple. This encourages milk flow. If your milk flows slowly, you may need to continue this action during the feeding.  Support your breast with 4 fingers underneath and your thumb above your nipple (make the letter "C" with your hand). Make sure your fingers are well away from your nipple and your baby's mouth.  Stroke your baby's lips gently with your finger or nipple.  When your baby's mouth is open wide enough, quickly bring your baby to your breast, placing your entire nipple and as much of the areola as possible into your baby's mouth. The areola is the colored area around your nipple. ? More areola should be visible above your baby's upper lip than below the lower lip. ? Your baby's lips should be opened and extended outward (flanged) to  ensure an adequate, comfortable latch. ? Your baby's tongue should be between his or her lower gum and your breast.  Make sure that your baby's mouth is correctly positioned around your nipple (latched). Your baby's lips should create a seal on your breast and be turned out (everted).  It is common for your baby to suck about 2-3 minutes in order to start the flow of breast milk. Latching Teaching your baby how to latch onto your breast properly is very important. An improper latch can cause nipple pain, decreased milk supply, and poor weight gain in your baby. Also, if your baby is not latched onto your nipple properly, he or she may swallow some air during feeding. This can make your baby fussy. Burping your baby when you switch breasts during the feeding can help to get rid of the air. However, teaching your  baby to latch on properly is still the best way to prevent fussiness from swallowing air while breastfeeding. Signs that your baby has successfully latched onto your nipple  Silent tugging or silent sucking, without causing you pain. Infant's lips should be extended outward (flanged).  Swallowing heard between every 3-4 sucks once your milk has started to flow (after your let-down milk reflex occurs).  Muscle movement above and in front of his or her ears while sucking.  Signs that your baby has not successfully latched onto your nipple  Sucking sounds or smacking sounds from your baby while breastfeeding.  Nipple pain.  If you think your baby has not latched on correctly, slip your finger into the corner of your baby's mouth to break the suction and place it between your baby's gums. Attempt to start breastfeeding again. Signs of successful breastfeeding Signs from your baby  Your baby will gradually decrease the number of sucks or will completely stop sucking.  Your baby will fall asleep.  Your baby's body will relax.  Your baby will retain a small amount of milk in his or  her mouth.  Your baby will let go of your breast by himself or herself.  Signs from you  Breasts that have increased in firmness, weight, and size 1-3 hours after feeding.  Breasts that are softer immediately after breastfeeding.  Increased milk volume, as well as a change in milk consistency and color by the fifth day of breastfeeding.  Nipples that are not sore, cracked, or bleeding.  Signs that your baby is getting enough milk  Wetting at least 1-2 diapers during the first 24 hours after birth.  Wetting at least 5-6 diapers every 24 hours for the first week after birth. The urine should be clear or pale yellow by the age of 5 days.  Wetting 6-8 diapers every 24 hours as your baby continues to grow and develop.  At least 3 stools in a 24-hour period by the age of 5 days. The stool should be soft and yellow.  At least 3 stools in a 24-hour period by the age of 7 days. The stool should be seedy and yellow.  No loss of weight greater than 10% of birth weight during the first 3 days of life.  Average weight gain of 4-7 oz (113-198 g) per week after the age of 4 days.  Consistent daily weight gain by the age of 5 days, without weight loss after the age of 2 weeks. After a feeding, your baby may spit up a small amount of milk. This is normal. Breastfeeding frequency and duration Frequent feeding will help you make more milk and can prevent sore nipples and extremely full breasts (breast engorgement). Breastfeed when you feel the need to reduce the fullness of your breasts or when your baby shows signs of hunger. This is called "breastfeeding on demand." Signs that your baby is hungry include:  Increased alertness, activity, or restlessness.  Movement of the head from side to side.  Opening of the mouth when the corner of the mouth or cheek is stroked (rooting).  Increased sucking sounds, smacking lips, cooing, sighing, or squeaking.  Hand-to-mouth movements and sucking on  fingers or hands.  Fussing or crying.  Avoid introducing a pacifier to your baby in the first 4-6 weeks after your baby is born. After this time, you may choose to use a pacifier. Research has shown that pacifier use during the first year of a baby's life decreases the risk of  sudden infant death syndrome (SIDS). Allow your baby to feed on each breast as long as he or she wants. When your baby unlatches or falls asleep while feeding from the first breast, offer the second breast. Because newborns are often sleepy in the first few weeks of life, you may need to awaken your baby to get him or her to feed. Breastfeeding times will vary from baby to baby. However, the following rules can serve as a guide to help you make sure that your baby is properly fed:  Newborns (babies 62 weeks of age or younger) may breastfeed every 1-3 hours.  Newborns should not go without breastfeeding for longer than 3 hours during the day or 5 hours during the night.  You should breastfeed your baby a minimum of 8 times in a 24-hour period.  Breast milk pumping Pumping and storing breast milk allows you to make sure that your baby is exclusively fed your breast milk, even at times when you are unable to breastfeed. This is especially important if you go back to work while you are still breastfeeding, or if you are not able to be present during feedings. Your lactation consultant can help you find a method of pumping that works best for you and give you guidelines about how long it is safe to store breast milk. Caring for your breasts while you breastfeed Nipples can become dry, cracked, and sore while breastfeeding. The following recommendations can help keep your breasts moisturized and healthy:  Avoid using soap on your nipples.  Wear a supportive bra designed especially for nursing. Avoid wearing underwire-style bras or extremely tight bras (sports bras).  Air-dry your nipples for 3-4 minutes after each  feeding.  Use only cotton bra pads to absorb leaked breast milk. Leaking of breast milk between feedings is normal.  Use lanolin on your nipples after breastfeeding. Lanolin helps to maintain your skin's normal moisture barrier. Pure lanolin is not harmful (not toxic) to your baby. You may also hand express a few drops of breast milk and gently massage that milk into your nipples and allow the milk to air-dry.  In the first few weeks after giving birth, some women experience breast engorgement. Engorgement can make your breasts feel heavy, warm, and tender to the touch. Engorgement peaks within 3-5 days after you give birth. The following recommendations can help to ease engorgement:  Completely empty your breasts while breastfeeding or pumping. You may want to start by applying warm, moist heat (in the shower or with warm, water-soaked hand towels) just before feeding or pumping. This increases circulation and helps the milk flow. If your baby does not completely empty your breasts while breastfeeding, pump any extra milk after he or she is finished.  Apply ice packs to your breasts immediately after breastfeeding or pumping, unless this is too uncomfortable for you. To do this: ? Put ice in a plastic bag. ? Place a towel between your skin and the bag. ? Leave the ice on for 20 minutes, 2-3 times a day.  Make sure that your baby is latched on and positioned properly while breastfeeding.  If engorgement persists after 48 hours of following these recommendations, contact your health care provider or a Science writer. Overall health care recommendations while breastfeeding  Eat 3 healthy meals and 3 snacks every day. Well-nourished mothers who are breastfeeding need an additional 450-500 calories a day. You can meet this requirement by increasing the amount of a balanced diet that you eat.  Drink enough water to keep your urine pale yellow or clear.  Rest often, relax, and continue to  take your prenatal vitamins to prevent fatigue, stress, and low vitamin and mineral levels in your body (nutrient deficiencies).  Do not use any products that contain nicotine or tobacco, such as cigarettes and e-cigarettes. Your baby may be harmed by chemicals from cigarettes that pass into breast milk and exposure to secondhand smoke. If you need help quitting, ask your health care provider.  Avoid alcohol.  Do not use illegal drugs or marijuana.  Talk with your health care provider before taking any medicines. These include over-the-counter and prescription medicines as well as vitamins and herbal supplements. Some medicines that may be harmful to your baby can pass through breast milk.  It is possible to become pregnant while breastfeeding. If birth control is desired, ask your health care provider about options that will be safe while breastfeeding your baby. Where to find more information: [employer]: www.llli.org Contact a health care provider if:  You feel like you want to stop breastfeeding or have become frustrated with breastfeeding.  Your nipples are cracked or bleeding.  Your breasts are red, tender, or warm.  You have: ? Painful breasts or nipples. ? A swollen area on either breast. ? A fever or chills. ? Nausea or vomiting. ? Drainage other than breast milk from your nipples.  Your breasts do not become full before feedings by the fifth day after you give birth.  You feel sad and depressed.  Your baby is: ? Too sleepy to eat well. ? Having trouble sleeping. ? More than 33 week old and wetting fewer than 6 diapers in a 24-hour period. ? Not gaining weight by 49 days of age.  Your baby has fewer than 3 stools in a 24-hour period.  Your baby's skin or the white parts of his or her eyes become yellow. Get help right away if:  Your baby is overly tired (lethargic) and does not want to wake up and feed.  Your baby develops an unexplained  fever. Summary  Breastfeeding offers many health benefits for infant and mothers.  Try to breastfeed your infant when he or she shows early signs of hunger.  Gently tickle or stroke your baby's lips with your finger or nipple to allow the baby to open his or her mouth. Bring the baby to your breast. Make sure that much of the areola is in your baby's mouth. Offer one side and burp the baby before you offer the other side.  Talk with your health care provider or lactation consultant if you have questions or you face problems as you breastfeed. This information is not intended to replace advice given to you by your health care provider. Make sure you discuss any questions you have with your health care provider. Document Released: 02/22/2005 Document Revised: 03/26/2016 Document Reviewed: 03/26/2016 Elsevier Interactive Patient Education  [employer].

## 2017-04-07 NOTE — Progress Notes (Signed)
[name]-Doing well. Follow up US today, finding reviewed with patient. Resolved complete previa, patient verbalized understanding. 28 week labs today. TDaP given. Blood transfusion consent reviewed and signed. Handouts regarding [place] volunteer doula program and postpartum contraception given. Education regarding intrapartum pain management options. Plans breastfeeding. Rx: Breast pump, see orders. Reviewed red flag symptoms and when to call. RTC x 2 weeks for [name] or sooner if needed.   ULTRASOUND REPORT  Location: [facility] Date of Service:  04/07/2017  Indications: Growth; Placenta Previa Findings:  Singleton intrauterine pregnancy is visualized with FHR at 150 BPM. Biometrics give an (U/S) Gestational age of 49 5/7 weeks and an (U/S) EDD of 06/18/17; this correlates with the clinically established EDD of 06/26/17.  Fetal presentation is vertex.  EFW: 1301 grams (2lb 14oz).  51st percentile. Placenta: Posterior and grade 1.  Placenta is at least 5.3 cm from cervical os.  Difficult to visualize on transabdominal scan, so transvaginal was performed.  Placenta is out of view on transvaginal approach and is at least 5.3 cm from cervical os. AFI: 11.6 cm.  Fetal stomach, kidneys, and bladder appears WNL.  Impression: 1. 29 5/7 week Viable Singleton Intrauterine pregnancy by U/S. 2. (U/S) EDD is consistent with Clinically established (LMP) EDD of 06/26/17. 3. EFW: 1301 grams (2lb 14oz).  51st percentile. 4. Patient no longer has placenta previa.  Placenta measures at least 5.3 cm from cervical os.  Recommendations: 1.Clinical correlation with the patient's History and Physical Exam.

## 2017-04-07 NOTE — Progress Notes (Signed)
[name]- Patient feels well with no complaints. She does want to know how to qualify for a breast pump.

## 2017-04-08 LAB — CBC WITH DIFFERENTIAL/PLATELET
Basophils Absolute: 0 10*3/uL (ref 0.0–0.2)
Basos: 0 %
EOS (ABSOLUTE): 0.1 10*3/uL (ref 0.0–0.4)
EOS: 1 %
HEMATOCRIT: 38.1 % (ref 34.0–46.6)
Hemoglobin: 12.3 g/dL (ref 11.1–15.9)
Immature Grans (Abs): 0.1 10*3/uL (ref 0.0–0.1)
Immature Granulocytes: 1 %
LYMPHS ABS: 1.9 10*3/uL (ref 0.7–3.1)
Lymphs: 14 %
MCH: 30.1 pg (ref 26.6–33.0)
MCHC: 32.3 g/dL (ref 31.5–35.7)
MCV: 93 fL (ref 79–97)
MONOCYTES: 7 %
MONOS ABS: 0.9 10*3/uL (ref 0.1–0.9)
NEUTROS ABS: 10 10*3/uL — AB (ref 1.4–7.0)
Neutrophils: 77 %
Platelets: 332 10*3/uL (ref 150–379)
RBC: 4.09 x10E6/uL (ref 3.77–5.28)
RDW: 13.7 % (ref 12.3–15.4)
WBC: 13 10*3/uL — AB (ref 3.4–10.8)

## 2017-04-08 LAB — RPR: RPR Ser Ql: NONREACTIVE

## 2017-04-08 LAB — GLUCOSE TOLERANCE, 1 HOUR: Glucose, 1Hr PP: 142 mg/dL (ref 65–199)

## 2017-04-09 ENCOUNTER — Encounter: Payer: Self-pay | Admitting: Certified Nurse Midwife

## 2017-04-09 ENCOUNTER — Other Ambulatory Visit: Payer: Self-pay | Admitting: Certified Nurse Midwife

## 2017-04-09 DIAGNOSIS — O09519 Supervision of elderly primigravida, unspecified trimester: Secondary | ICD-10-CM | POA: Insufficient documentation

## 2017-04-09 DIAGNOSIS — O99810 Abnormal glucose complicating pregnancy: Secondary | ICD-10-CM | POA: Insufficient documentation

## 2017-04-09 DIAGNOSIS — O09513 Supervision of elderly primigravida, third trimester: Secondary | ICD-10-CM

## 2017-04-10 ENCOUNTER — Encounter: Payer: Self-pay | Admitting: Certified Nurse Midwife

## 2017-04-11 ENCOUNTER — Other Ambulatory Visit: Payer: Self-pay

## 2017-04-11 DIAGNOSIS — R7309 Other abnormal glucose: Secondary | ICD-10-CM

## 2017-04-15 ENCOUNTER — Other Ambulatory Visit: Payer: BLUE CROSS/BLUE SHIELD

## 2017-04-15 DIAGNOSIS — R7309 Other abnormal glucose: Secondary | ICD-10-CM

## 2017-04-16 LAB — GESTATIONAL GLUCOSE TOLERANCE
GLUCOSE 3 HOUR GTT: 105 mg/dL (ref 65–139)
Glucose, Fasting: 69 mg/dL (ref 65–94)
Glucose, GTT - 1 Hour: 142 mg/dL (ref 65–179)
Glucose, GTT - 2 Hour: 137 mg/dL (ref 65–154)

## 2017-04-19 ENCOUNTER — Encounter: Payer: Self-pay | Admitting: Certified Nurse Midwife

## 2017-04-19 ENCOUNTER — Ambulatory Visit (INDEPENDENT_AMBULATORY_CARE_PROVIDER_SITE_OTHER): Payer: BLUE CROSS/BLUE SHIELD | Admitting: Certified Nurse Midwife

## 2017-04-19 VITALS — BP 127/73 | HR 83 | Wt 140.2 lb

## 2017-04-19 DIAGNOSIS — Z3493 Encounter for supervision of normal pregnancy, unspecified, third trimester: Secondary | ICD-10-CM

## 2017-04-19 LAB — POCT URINALYSIS DIPSTICK
BILIRUBIN UA: NEGATIVE
GLUCOSE UA: NEGATIVE
Ketones, UA: NEGATIVE
Leukocytes, UA: NEGATIVE
Nitrite, UA: NEGATIVE
Protein, UA: NEGATIVE
RBC UA: NEGATIVE
SPEC GRAV UA: 1.02 (ref 1.010–1.025)
Urobilinogen, UA: 0.2 E.U./dL
pH, UA: 5 (ref 5.0–8.0)

## 2017-04-19 NOTE — Progress Notes (Signed)
Pt is here for an [name] visit. 

## 2017-04-19 NOTE — Progress Notes (Signed)
[name], doing well. No complaints. Feels good fetal movement. PTL precautions reviewed. Follow up 2 wks.   Philip Aspen, CNM

## 2017-04-19 NOTE — Patient Instructions (Signed)

## 2017-04-20 ENCOUNTER — Encounter: Payer: BLUE CROSS/BLUE SHIELD | Admitting: Certified Nurse Midwife

## 2017-05-04 ENCOUNTER — Ambulatory Visit (INDEPENDENT_AMBULATORY_CARE_PROVIDER_SITE_OTHER): Payer: BLUE CROSS/BLUE SHIELD | Admitting: Certified Nurse Midwife

## 2017-05-04 VITALS — BP 121/70 | HR 89 | Wt 142.4 lb

## 2017-05-04 DIAGNOSIS — Z3493 Encounter for supervision of normal pregnancy, unspecified, third trimester: Secondary | ICD-10-CM

## 2017-05-04 LAB — POCT URINALYSIS DIPSTICK
BILIRUBIN UA: NEGATIVE
Blood, UA: NEGATIVE
Glucose, UA: NEGATIVE
KETONES UA: NEGATIVE
Leukocytes, UA: NEGATIVE
NITRITE UA: NEGATIVE
PH UA: 7 (ref 5.0–8.0)
PROTEIN UA: NEGATIVE
SPEC GRAV UA: 1.01 (ref 1.010–1.025)
UROBILINOGEN UA: 0.2 U/dL

## 2017-05-04 NOTE — Progress Notes (Signed)
[name] doing well. Denies complaints. Request information on peds. Information placed in after visit summary. Discussed birth and starting to think about birth plan. She said she may use a Doula. Encouragement given on use of Doula. She feels good fetal movement and denies contractions. Return in 2 wks.   Philip Aspen, CNM

## 2017-05-04 NOTE — Patient Instructions (Signed)
[place], [place], [place] [address]  Phone: [phone]   [facility] (second location)  [address] [place], [place] [address]  Phone: [phone]   [facility]) [place], [place], [place] [address] Phone: [phone]   [address] [place],  [address]  Phone: [phone] a Baby Grows During Pregnancy Pregnancy begins when a female's sperm enters a female's egg (fertilization). This happens in one of the tubes (fallopian tubes) that connect the ovaries to the womb (uterus). The fertilized egg is called an embryo until it reaches 10 weeks. From 10 weeks until birth, it is called a fetus. The fertilized egg moves down the fallopian tube to the uterus. Then it implants into the lining of the uterus and begins to grow. The developing fetus receives oxygen and nutrients through the pregnant woman's bloodstream and the tissues that grow (placenta) to support the fetus. The placenta is the life support system for the fetus. It provides nutrition and removes waste. Learning as much as you can about your pregnancy and how your baby is developing can help you enjoy the experience. It can also make you aware of when there might be a problem and when to ask questions. How long does a typical pregnancy last? A pregnancy usually lasts 280 days, or about 40 weeks. Pregnancy is divided into three trimesters:  First trimester: 0-13 weeks.  Second trimester: 14-27 weeks.  Third trimester: 28-40 weeks.  The day when your baby is considered ready to be born (full term) is your estimated date of delivery. How does my baby develop month by month? First month  The fertilized egg attaches to the inside of the uterus.  Some cells will form the placenta. Others will form the fetus.  The arms, legs, brain, spinal cord, lungs, and heart begin to develop.  At  the end of the first month, the heart begins to beat.  Second month  The bones, inner ear, eyelids, hands, and feet form.  The genitals develop.  By the end of 8 weeks, all major organs are developing.  Third month  All of the internal organs are forming.  Teeth develop below the gums.  Bones and muscles begin to grow. The spine can flex.  The skin is transparent.  Fingernails and toenails begin to form.  Arms and legs continue to grow longer, and hands and feet develop.  The fetus is about 3 in (7.6 cm) long.  Fourth month  The placenta is completely formed.  The external sex organs, neck, outer ear, eyebrows, eyelids, and fingernails are formed.  The fetus can hear, swallow, and move its arms and legs.  The kidneys begin to produce urine.  The skin is covered with a white waxy coating (vernix) and very fine hair (lanugo).  Fifth month  The fetus moves around more and can be felt for the first time (quickening).  The fetus starts to sleep and wake up and may begin to suck its finger.  The nails grow to the end of the fingers.  The organ in the digestive system that makes bile (gallbladder) functions and helps to digest the nutrients.  If your baby is a girl, eggs are present in her ovaries. If your baby is a boy, testicles start to move down into his scrotum.  Sixth month  The lungs are formed, but the fetus is not yet able to breathe.  The eyes open. The brain continues to develop.  Your baby has fingerprints and toe prints. Your baby's hair grows thicker.  At the end of the second trimester, the fetus is about 9 in (22.9 cm) long.  Seventh month  The fetus kicks and stretches.  The eyes are developed enough to sense changes in light.  The hands can make a grasping motion.  The fetus responds to sound.  Eighth month  All organs and body systems are fully developed and functioning.  Bones harden and taste buds develop. The fetus may  hiccup.  Certain areas of the brain are still developing. The skull remains soft.  Ninth month  The fetus gains about  lb (0.23 kg) each week.  The lungs are fully developed.  Patterns of sleep develop.  The fetus's head typically moves into a head-down position (vertex) in the uterus to prepare for birth. If the buttocks move into a vertex position instead, the baby is breech.  The fetus weighs 6-9 lbs (2.72-4.08 kg) and is 19-20 in (48.26-50.8 cm) long.  What can I do to have a healthy pregnancy and help my baby develop? Eating and Drinking  Eat a healthy diet. ? Talk with your health care provider to make sure that you are getting the nutrients that you and your baby need. ? Visit www.BuildDNA.es to learn about creating a healthy diet.  Gain a healthy amount of weight during pregnancy as advised by your health care provider. This is usually 25-35 pounds. You may need to: ? Gain more if you were underweight before getting pregnant or if you are pregnant with more than one baby. ? Gain less if you were overweight or obese when you got pregnant.  Medicines and Vitamins  Take prenatal vitamins as directed by your health care provider. These include vitamins such as folic acid, iron, calcium, and vitamin D. They are important for healthy development.  Take medicines only as directed by your health care provider. Read labels and ask a pharmacist or your health care provider whether over-the-counter medicines, supplements, and prescription drugs are safe to take during pregnancy.  Activities  Be physically active as advised by your health care provider. Ask your health care provider to recommend activities that are safe for you to do, such as walking or swimming.  Do not participate in strenuous or extreme sports.  Lifestyle  Do not drink alcohol.  Do not use any tobacco products, including cigarettes, chewing tobacco, or electronic cigarettes. If you need help quitting,  ask your health care provider.  Do not use illegal drugs.  Safety  Avoid exposure to mercury, lead, or other heavy metals. Ask your health care provider about common sources of these heavy metals.  Avoid listeria infection during pregnancy. Follow these precautions: ? Do not eat soft cheeses or deli meats. ? Do not eat hot dogs unless they have been warmed up to the point of steaming, such as in the microwave oven. ? Do not drink unpasteurized milk.  Avoid toxoplasmosis infection during pregnancy. Follow these precautions: ? Do not change your cat's litter box, if you have a cat. Ask someone else to do this for you. ? Wear gardening gloves while working in the yard.  General Instructions  Keep all follow-up visits as directed by your health care provider. This is important. This includes prenatal care and screening tests.  Manage any chronic health conditions. Work closely with your health care provider to keep conditions, such as diabetes, under control.  How do I know if my baby is developing well? At each prenatal visit, your health care provider will do several different tests to check on your health and keep track of your baby's development. These include:  Fundal height. ? Your health care provider will measure your growing belly from top to bottom using a tape measure. ? Your health care provider will also feel your belly to determine your baby's position.  Heartbeat. ? An ultrasound in the first trimester can confirm pregnancy and show a heartbeat, depending on how far along you are. ? Your health care provider will check your baby's heart rate at every prenatal visit. ? As you get closer to your delivery date, you may have regular fetal heart rate monitoring to make sure that your baby is not in distress.  Second trimester ultrasound. ? This ultrasound checks your baby's development. It also indicates your baby's gender.  What should I do if I have concerns about my  baby's development? Always talk with your health care provider about any concerns that you may have. This information is not intended to replace advice given to you by your health care provider. Make sure you discuss any questions you have with your health care provider. Document Released: 08/11/2007 Document Revised: 07/31/2015 Document Reviewed: 08/01/2013 Elsevier Interactive Patient Education  [employer].

## 2017-05-04 NOTE — Progress Notes (Signed)
Pt is here for an [name] visit. 

## 2017-05-18 ENCOUNTER — Ambulatory Visit (INDEPENDENT_AMBULATORY_CARE_PROVIDER_SITE_OTHER): Payer: BLUE CROSS/BLUE SHIELD | Admitting: Obstetrics and Gynecology

## 2017-05-18 VITALS — BP 128/82 | HR 80 | Wt 143.9 lb

## 2017-05-18 DIAGNOSIS — Z3493 Encounter for supervision of normal pregnancy, unspecified, third trimester: Secondary | ICD-10-CM | POA: Diagnosis not present

## 2017-05-18 LAB — POCT URINALYSIS DIPSTICK
Bilirubin, UA: NEGATIVE
Blood, UA: NEGATIVE
Glucose, UA: NEGATIVE
Ketones, UA: NEGATIVE
LEUKOCYTES UA: NEGATIVE
Nitrite, UA: NEGATIVE
PH UA: 6 (ref 5.0–8.0)
PROTEIN UA: NEGATIVE
Spec Grav, UA: 1.01 (ref 1.010–1.025)
UROBILINOGEN UA: 0.2 U/dL

## 2017-05-18 NOTE — Patient Instructions (Signed)
Heartburn During Pregnancy Heartburn is pain or discomfort in the throat or chest. It may cause a burning feeling. It happens when stomach acid moves up into the tube that carries food from your mouth to your stomach (esophagus). Heartburn is common during pregnancy. It usually goes away or gets better after giving birth. Follow these instructions at home: Eating and drinking  Do not drink alcohol while you are pregnant.  Figure out which foods and beverages make you feel worse, and avoid them.  Beverages that you may want to avoid include: ? Coffee and tea (with or without caffeine). ? Energy drinks and sports drinks. ? Bubbly (carbonated) drinks or sodas. ? Citrus fruit juices.  Foods that you may want to avoid include: ? Chocolate and cocoa. ? Peppermint and mint flavorings. ? Garlic, onions, and horseradish. ? Spicy and acidic foods. These include peppers, chili powder, curry powder, vinegar, hot sauces, and barbecue sauce. ? Citrus fruits, such as oranges, lemons, and limes. ? Tomato-based foods, such as red sauce, chili, and salsa. ? Fried and fatty foods, such as donuts, french fries, potato chips, and high-fat dressings. ? High-fat meats, such as hot dogs, cold cuts, sausage, ham, and bacon. ? High-fat dairy items, such as whole milk, butter, and cheese.  Eat small meals often, instead of large meals.  Avoid drinking a lot of liquid with your meals.  Avoid eating meals during the 2-3 hours before you go to bed.  Avoid lying down right after you eat.  Do not exercise right after you eat. Medicines  Take over-the-counter and prescription medicines only as told by your doctor.  Do not take aspirin, ibuprofen, or other NSAIDs unless your doctor tells you to do that.  Your doctor may tell you to avoid medicines that have sodium bicarbonate in them. General instructions  If told, raise the head of your bed about 6 inches (15 cm). You can do this by putting blocks under  the legs. Sleeping with more pillows does not help with heartburn.  Do not use any products that contain nicotine or tobacco, such as cigarettes and e-cigarettes. If you need help quitting, ask your doctor.  Wear loose-fitting clothing.  Try to lower your stress, such as with yoga or meditation. If you need help, ask your doctor.  Stay at a healthy weight. If you are overweight, work with your doctor to safely lose weight.  Keep all follow-up visits as told by your doctor. This is important. Contact a doctor if:  You get new symptoms.  Your symptoms do not get better with treatment.  You have weight loss and you do not know why.  You have trouble swallowing.  You make loud sounds when you breathe (wheeze).  You have a cough that does not go away.  You have heartburn often for more than 2 weeks.  You feel sick to your stomach (nauseous), and this does not get better with treatment.  You are throwing up (vomiting), and this does not get better with treatment.  You have pain in your belly (abdomen). Get help right away if:  You have very bad chest pain that spreads to your arm, neck, or jaw.  You feel sweaty, dizzy, or light-headed.  You have trouble breathing.  You have pain when swallowing.  You throw up and your throw-up looks like blood or coffee grounds.  Your poop (stool) is bloody or black. This information is not intended to replace advice given to you by your health care provider.  Make sure you discuss any questions you have with your health care provider. Document Released: 03/27/2010 Document Revised: 11/10/2015 Document Reviewed: 11/10/2015 [employer] Interactive Patient Education  2017 [employer]  

## 2017-05-18 NOTE — Progress Notes (Signed)
[name]-doing well, nightly heartburn. Recommended zantac nightly.

## 2017-05-18 NOTE — Progress Notes (Signed)
[name]- pt is having heartburn and insomnia, some pelvic pressure

## 2017-06-02 ENCOUNTER — Ambulatory Visit (INDEPENDENT_AMBULATORY_CARE_PROVIDER_SITE_OTHER): Payer: BLUE CROSS/BLUE SHIELD | Admitting: Certified Nurse Midwife

## 2017-06-02 VITALS — BP 112/78 | HR 87 | Wt 146.2 lb

## 2017-06-02 DIAGNOSIS — Z3493 Encounter for supervision of normal pregnancy, unspecified, third trimester: Secondary | ICD-10-CM

## 2017-06-02 LAB — POCT URINALYSIS DIPSTICK
Bilirubin, UA: NEGATIVE
Glucose, UA: NEGATIVE
KETONES UA: NEGATIVE
Leukocytes, UA: NEGATIVE
NITRITE UA: NEGATIVE
PROTEIN UA: NEGATIVE
RBC UA: NEGATIVE
SPEC GRAV UA: 1.025 (ref 1.010–1.025)
UROBILINOGEN UA: 0.2 U/dL
pH, UA: 5 (ref 5.0–8.0)

## 2017-06-02 NOTE — Progress Notes (Signed)
Pt is here for an [name] visit.Is due for cultures. 

## 2017-06-02 NOTE — Progress Notes (Signed)
[name]-Doing well, no questions or concerns. 36 week cultures collected. SVE requested. Reviewed red flag symptoms and when to call. RTC x 1 week for [name] or sooner if needed.

## 2017-06-02 NOTE — Patient Instructions (Signed)
Braxton Hicks Contractions Contractions of the uterus can occur throughout pregnancy, but they are not always a sign that you are in labor. You may have practice contractions called Braxton Hicks contractions. These false labor contractions are sometimes confused with true labor. What are [place] contractions? Braxton Hicks contractions are tightening movements that occur in the muscles of the uterus before labor. Unlike true labor contractions, these contractions do not result in opening (dilation) and thinning of the cervix. Toward the end of pregnancy (32-34 weeks), Braxton Hicks contractions can happen more often and may become stronger. These contractions are sometimes difficult to tell apart from true labor because they can be very uncomfortable. You should not feel embarrassed if you go to the hospital with false labor. Sometimes, the only way to tell if you are in true labor is for your health care provider to look for changes in the cervix. The health care provider will do a physical exam and may monitor your contractions. If you are not in true labor, the exam should show that your cervix is not dilating and your water has not broken. If there are other health problems associated with your pregnancy, it is completely safe for you to be sent home with false labor. You may continue to have Braxton Hicks contractions until you go into true labor. How to tell the difference between true labor and false labor True labor  Contractions last 30-70 seconds.  Contractions become very regular.  Discomfort is usually felt in the top of the uterus, and it spreads to the lower abdomen and low back.  Contractions do not go away with walking.  Contractions usually become more intense and increase in frequency.  The cervix dilates and gets thinner. False labor  Contractions are usually shorter and not as strong as true labor contractions.  Contractions are usually irregular.  Contractions  are often felt in the front of the lower abdomen and in the groin.  Contractions may go away when you walk around or change positions while lying down.  Contractions get weaker and are shorter-lasting as time goes on.  The cervix usually does not dilate or become thin. Follow these instructions at home:  Take over-the-counter and prescription medicines only as told by your health care provider.  Keep up with your usual exercises and follow other instructions from your health care provider.  Eat and drink lightly if you think you are going into labor.  If Braxton Hicks contractions are making you uncomfortable: ? Change your position from lying down or resting to walking, or change from walking to resting. ? Sit and rest in a tub of warm water. ? Drink enough fluid to keep your urine pale yellow. Dehydration may cause these contractions. ? Do slow and deep breathing several times an hour.  Keep all follow-up prenatal visits as told by your health care provider. This is important. Contact a health care provider if:  You have a fever.  You have continuous pain in your abdomen. Get help right away if:  Your contractions become stronger, more regular, and closer together.  You have fluid leaking or gushing from your vagina.  You pass blood-tinged mucus (bloody show).  You have bleeding from your vagina.  You have low back pain that you never had before.  You feel your baby's head pushing down and causing pelvic pressure.  Your baby is not moving inside you as much as it used to. Summary  Contractions that occur before labor are called Braxton  Hicks contractions, false labor, or practice contractions.  Braxton Hicks contractions are usually shorter, weaker, farther apart, and less regular than true labor contractions. True labor contractions usually become progressively stronger and regular and they become more frequent.  Manage discomfort from [facility] contractions by  changing position, resting in a warm bath, drinking plenty of water, or practicing deep breathing. This information is not intended to replace advice given to you by your health care provider. Make sure you discuss any questions you have with your health care provider. Document Released: 07/08/2016 Document Revised: 07/08/2016 Document Reviewed: 07/08/2016 Elsevier Interactive Patient Education  2018 [employer]. Preeclampsia and Eclampsia Preeclampsia is a serious condition that develops only during pregnancy. It is also called toxemia of pregnancy. This condition causes high blood pressure along with other symptoms, such as swelling and headaches. These symptoms may develop as the condition gets worse. Preeclampsia may occur at 20 weeks of pregnancy or later. Diagnosing and treating preeclampsia early is very important. If not treated early, it can cause serious problems for you and your baby. One problem it can lead to is eclampsia, which is a condition that causes muscle jerking or shaking (convulsions or seizures) in the mother. Delivering your baby is the best treatment for preeclampsia or eclampsia. Preeclampsia and eclampsia symptoms usually go away after your baby is born. What are the causes? The cause of preeclampsia is not known. What increases the risk? The following risk factors make you more likely to develop preeclampsia:  Being pregnant for the first time.  Having had preeclampsia during a past pregnancy.  Having a family history of preeclampsia.  Having high blood pressure.  Being pregnant with twins or triplets.  Being 33 or older.  Being African-American.  Having kidney disease or diabetes.  Having medical conditions such as lupus or blood diseases.  Being very overweight (obese).  What are the signs or symptoms? The earliest signs of preeclampsia are:  High blood pressure.  Increased protein in your urine. Your health care provider will check for this at  every visit before you give birth (prenatal visit).  Other symptoms that may develop as the condition gets worse include:  Severe headaches.  Sudden weight gain.  Swelling of the hands, face, legs, and feet.  Nausea and vomiting.  Vision problems, such as blurred or double vision.  Numbness in the face, arms, legs, and feet.  Urinating less than usual.  Dizziness.  Slurred speech.  Abdominal pain, especially upper abdominal pain.  Convulsions or seizures.  Symptoms generally go away after giving birth. How is this diagnosed? There are no screening tests for preeclampsia. Your health care provider will ask you about symptoms and check for signs of preeclampsia during your prenatal visits. You may also have tests that include:  Urine tests.  Blood tests.  Checking your blood pressure.  Monitoring your baby's heart rate.  Ultrasound.  How is this treated? You and your health care provider will determine the treatment approach that is best for you. Treatment may include:  Having more frequent prenatal exams to check for signs of preeclampsia, if you have an increased risk for preeclampsia.  Bed rest.  Reducing how much salt (sodium) you eat.  Medicine to lower your blood pressure.  Staying in the hospital, if your condition is severe. There, treatment will focus on controlling your blood pressure and the amount of fluids in your body (fluid retention).  You may need to take medicine (magnesium sulfate) to prevent seizures. This  medicine may be given as an injection or through an IV tube.  Delivering your baby early, if your condition gets worse. You may have your labor started with medicine (induced), or you may have a cesarean delivery.  Follow these instructions at home: Eating and drinking   Drink enough fluid to keep your urine clear or pale yellow.  Eat a healthy diet that is low in sodium. Do not add salt to your food. Check nutrition labels to see how  much sodium a food or beverage contains.  Avoid caffeine. Lifestyle  Do not use any products that contain nicotine or tobacco, such as cigarettes and e-cigarettes. If you need help quitting, ask your health care provider.  Do not use alcohol or drugs.  Avoid stress as much as possible. Rest and get plenty of sleep. General instructions  Take over-the-counter and prescription medicines only as told by your health care provider.  When lying down, lie on your side. This keeps pressure off of your baby.  When sitting or lying down, raise (elevate) your feet. Try putting some pillows underneath your lower legs.  Exercise regularly. Ask your health care provider what kinds of exercise are best for you.  Keep all follow-up and prenatal visits as told by your health care provider. This is important. How is this prevented? To prevent preeclampsia or eclampsia from developing during another pregnancy:  Get proper medical care during pregnancy. Your health care provider may be able to prevent preeclampsia or diagnose and treat it early.  Your health care provider may have you take a low-dose aspirin or a calcium supplement during your next pregnancy.  You may have tests of your blood pressure and kidney function after giving birth.  Maintain a healthy weight. Ask your health care provider for help managing weight gain during pregnancy.  Work with your health care provider to manage any long-term (chronic) health conditions you have, such as diabetes or kidney problems.  Contact a health care provider if:  You gain more weight than expected.  You have headaches.  You have nausea or vomiting.  You have abdominal pain.  You feel dizzy or light-headed. Get help right away if:  You develop sudden or severe swelling anywhere in your body. This usually happens in the legs.  You gain 5 lbs (2.3 kg) or more during one week.  You have severe: ? Abdominal  pain. ? Headaches. ? Dizziness. ? Vision problems. ? Confusion. ? Nausea or vomiting.  You have a seizure.  You have trouble moving any part of your body.  You develop numbness in any part of your body.  You have trouble speaking.  You have any abnormal bleeding.  You pass out. This information is not intended to replace advice given to you by your health care provider. Make sure you discuss any questions you have with your health care provider. Document Released: 02/20/2000 Document Revised: 10/21/2015 Document Reviewed: 09/29/2015 Elsevier Interactive Patient Education  2018 [employer]. Vaginal Delivery Vaginal delivery means that you will give birth by pushing your baby out of your birth canal (vagina). A team of health care providers will help you before, during, and after vaginal delivery. Birth experiences are unique for every woman and every pregnancy, and birth experiences vary depending on where you choose to give birth. What should I do to prepare for my baby's birth? Before your baby is born, it is important to talk with your health care provider about:  Your labor and delivery preferences. These  may include: ? Medicines that you may be given. ? How you will manage your pain. This might include non-medical pain relief techniques or injectable pain relief such as epidural analgesia. ? How you and your baby will be monitored during labor and delivery. ? Who may be in the labor and delivery room with you. ? Your feelings about surgical delivery of your baby (cesarean delivery, or C-section) if this becomes necessary. ? Your feelings about receiving donated blood through an IV tube (blood transfusion) if this becomes necessary.  Whether you are able: ? To take pictures or videos of the birth. ? To eat during labor and delivery. ? To move around, walk, or change positions during labor and delivery.  What to expect after your baby is born, such as: ? Whether delayed  umbilical cord clamping and cutting is offered. ? Who will care for your baby right after birth. ? Medicines or tests that may be recommended for your baby. ? Whether breastfeeding is supported in your hospital or birth center. ? How long you will be in the hospital or birth center.  How any medical conditions you have may affect your baby or your labor and delivery experience.  To prepare for your baby's birth, you should also:  Attend all of your health care visits before delivery (prenatal visits) as recommended by your health care provider. This is important.  Prepare your home for your baby's arrival. Make sure that you have: ? Diapers. ? Baby clothing. ? Feeding equipment. ? Safe sleeping arrangements for you and your baby.  Install a car seat in your vehicle. Have your car seat checked by a certified car seat installer to make sure that it is installed safely.  Think about who will help you with your new baby at home for at least the first several weeks after delivery.  What can I expect when I arrive at the birth center or hospital? Once you are in labor and have been admitted into the hospital or birth center, your health care provider may:  Review your pregnancy history and any concerns you have.  Insert an IV tube into one of your veins. This is used to give you fluids and medicines.  Check your blood pressure, pulse, temperature, and heart rate (vital signs).  Check whether your bag of water (amniotic sac) has broken (ruptured).  Talk with you about your birth plan and discuss pain control options.  Monitoring Your health care provider may monitor your contractions (uterine monitoring) and your baby's heart rate (fetal monitoring). You may need to be monitored:  Often, but not continuously (intermittently).  All the time or for long periods at a time (continuously). Continuous monitoring may be needed if: ? You are taking certain medicines, such as medicine to  relieve pain or make your contractions stronger. ? You have pregnancy or labor complications.  Monitoring may be done by:  Placing a special stethoscope or a handheld monitoring device on your abdomen to check your baby's heartbeat, and feeling your abdomen for contractions. This method of monitoring does not continuously record your baby's heartbeat or your contractions.  Placing monitors on your abdomen (external monitors) to record your baby's heartbeat and the frequency and length of contractions. You may not have to wear external monitors all the time.  Placing monitors inside of your uterus (internal monitors) to record your baby's heartbeat and the frequency, length, and strength of your contractions. ? Your health care provider may use internal monitors if he  or she needs more information about the strength of your contractions or your baby's heart rate. ? Internal monitors are put in place by passing a thin, flexible wire through your vagina and into your uterus. Depending on the type of monitor, it may remain in your uterus or on your baby's head until birth. ? Your health care provider will discuss the benefits and risks of internal monitoring with you and will ask for your permission before inserting the monitors.  Telemetry. This is a type of continuous monitoring that can be done with external or internal monitors. Instead of having to stay in bed, you are able to move around during telemetry. Ask your health care provider if telemetry is an option for you.  Physical exam Your health care provider may perform a physical exam. This may include:  Checking whether your baby is positioned: ? With the head toward your vagina (head-down). This is most common. ? With the head toward the top of your uterus (head-up or breech). If your baby is in a breech position, your health care provider may try to turn your baby to a head-down position so you can deliver vaginally. If it does not seem  that your baby can be born vaginally, your provider may recommend surgery to deliver your baby. In rare cases, you may be able to deliver vaginally if your baby is head-up (breech delivery). ? Lying sideways (transverse). Babies that are lying sideways cannot be delivered vaginally.  Checking your cervix to determine: ? Whether it is thinning out (effacing). ? Whether it is opening up (dilating). ? How low your baby has moved into your birth canal.  What are the three stages of labor and delivery?  Normal labor and delivery is divided into the following three stages: Stage 1  Stage 1 is the longest stage of labor, and it can last for hours or days. Stage 1 includes: ? Early labor. This is when contractions may be irregular, or regular and mild. Generally, early labor contractions are more than 10 minutes apart. ? Active labor. This is when contractions get longer, more regular, more frequent, and more intense. ? The transition phase. This is when contractions happen very close together, are very intense, and may last longer than during any other part of labor.  Contractions generally feel mild, infrequent, and irregular at first. They get stronger, more frequent (about every 2-3 minutes), and more regular as you progress from early labor through active labor and transition.  Many women progress through stage 1 naturally, but you may need help to continue making progress. If this happens, your health care provider may talk with you about: ? Rupturing your amniotic sac if it has not ruptured yet. ? Giving you medicine to help make your contractions stronger and more frequent.  Stage 1 ends when your cervix is completely dilated to 4 inches (10 cm) and completely effaced. This happens at the end of the transition phase. Stage 2  Once your cervix is completely effaced and dilated to 4 inches (10 cm), you may start to feel an urge to push. It is common for the body to naturally take a rest  before feeling the urge to push, especially if you received an epidural or certain other pain medicines. This rest period may last for up to 1-2 hours, depending on your unique labor experience.  During stage 2, contractions are generally less painful, because pushing helps relieve contraction pain. Instead of contraction pain, you may feel stretching and  burning pain, especially when the widest part of your baby's head passes through the vaginal opening (crowning).  Your health care provider will closely monitor your pushing progress and your baby's progress through the vagina during stage 2.  Your health care provider may massage the area of skin between your vaginal opening and anus (perineum) or apply warm compresses to your perineum. This helps it stretch as the baby's head starts to crown, which can help prevent perineal tearing. ? In some cases, an incision may be made in your perineum (episiotomy) to allow the baby to pass through the vaginal opening. An episiotomy helps to make the opening of the vagina larger to allow more room for the baby to fit through.  It is very important to breathe and focus so your health care provider can control the delivery of your baby's head. Your health care provider may have you decrease the intensity of your pushing, to help prevent perineal tearing.  After delivery of your baby's head, the shoulders and the rest of the body generally deliver very quickly and without difficulty.  Once your baby is delivered, the umbilical cord may be cut right away, or this may be delayed for 1-2 minutes, depending on your baby's health. This may vary among health care providers, hospitals, and birth centers.  If you and your baby are healthy enough, your baby may be placed on your chest or abdomen to help maintain the baby's temperature and to help you bond with each other. Some mothers and babies start breastfeeding at this time. Your health care team will dry your baby and  help keep your baby warm during this time.  Your baby may need immediate care if he or she: ? Showed signs of distress during labor. ? Has a medical condition. ? Was born too early (prematurely). ? Had a bowel movement before birth (meconium). ? Shows signs of difficulty transitioning from being inside the uterus to being outside of the uterus. If you are planning to breastfeed, your health care team will help you begin a feeding. Stage 3  The third stage of labor starts immediately after the birth of your baby and ends after you deliver the placenta. The placenta is an organ that develops during pregnancy to provide oxygen and nutrients to your baby in the womb.  Delivering the placenta may require some pushing, and you may have mild contractions. Breastfeeding can stimulate contractions to help you deliver the placenta.  After the placenta is delivered, your uterus should tighten (contract) and become firm. This helps to stop bleeding in your uterus. To help your uterus contract and to control bleeding, your health care provider may: ? Give you medicine by injection, through an IV tube, by mouth, or through your rectum (rectally). ? Massage your abdomen or perform a vaginal exam to remove any blood clots that are left in your uterus. ? Empty your bladder by placing a thin, flexible tube (catheter) into your bladder. ? Encourage you to breastfeed your baby. After labor is over, you and your baby will be monitored closely to ensure that you are both healthy until you are ready to go home. Your health care team will teach you how to care for yourself and your baby. This information is not intended to replace advice given to you by your health care provider. Make sure you discuss any questions you have with your health care provider. Document Released: 12/02/2007 Document Revised: 09/12/2015 Document Reviewed: 03/09/2015 Elsevier Interactive Patient Education  2018  [employer].

## 2017-06-04 LAB — STREP GP B NAA: STREP GROUP B AG: POSITIVE — AB

## 2017-06-05 LAB — GC/CHLAMYDIA PROBE AMP
Chlamydia trachomatis, NAA: NEGATIVE
Neisseria gonorrhoeae by PCR: NEGATIVE

## 2017-06-06 ENCOUNTER — Encounter: Payer: Self-pay | Admitting: Certified Nurse Midwife

## 2017-06-06 DIAGNOSIS — B951 Streptococcus, group B, as the cause of diseases classified elsewhere: Secondary | ICD-10-CM | POA: Insufficient documentation

## 2017-06-09 ENCOUNTER — Ambulatory Visit (INDEPENDENT_AMBULATORY_CARE_PROVIDER_SITE_OTHER): Payer: BLUE CROSS/BLUE SHIELD | Admitting: Obstetrics and Gynecology

## 2017-06-09 VITALS — BP 138/78 | HR 84 | Wt 145.7 lb

## 2017-06-09 DIAGNOSIS — Z3493 Encounter for supervision of normal pregnancy, unspecified, third trimester: Secondary | ICD-10-CM

## 2017-06-09 LAB — POCT URINALYSIS DIPSTICK
Bilirubin, UA: NEGATIVE
Glucose, UA: NEGATIVE
Ketones, UA: NEGATIVE
LEUKOCYTES UA: NEGATIVE
Nitrite, UA: NEGATIVE
PH UA: 6 (ref 5.0–8.0)
PROTEIN UA: NEGATIVE
RBC UA: NEGATIVE
Spec Grav, UA: 1.015 (ref 1.010–1.025)
UROBILINOGEN UA: 0.2 U/dL

## 2017-06-09 NOTE — Progress Notes (Signed)
[name]-doing well. Discussed labor precautions

## 2017-06-09 NOTE — Progress Notes (Signed)
[name]- pt is having some pelvic pressure, BP elevated some pt denies headache

## 2017-06-14 ENCOUNTER — Inpatient Hospital Stay
Admission: EM | Admit: 2017-06-14 | Discharge: 2017-06-17 | DRG: 807 | Disposition: A | Discharge status: Home / Self Care | Payer: BLUE CROSS/BLUE SHIELD | Source: Ambulatory Visit | Attending: Obstetrics and Gynecology | Admitting: Obstetrics and Gynecology

## 2017-06-14 ENCOUNTER — Other Ambulatory Visit: Payer: Self-pay

## 2017-06-14 DIAGNOSIS — Z3A38 38 weeks gestation of pregnancy: Secondary | ICD-10-CM

## 2017-06-14 DIAGNOSIS — O4292 Full-term premature rupture of membranes, unspecified as to length of time between rupture and onset of labor: Principal | ICD-10-CM | POA: Diagnosis present

## 2017-06-14 DIAGNOSIS — O09513 Supervision of elderly primigravida, third trimester: Secondary | ICD-10-CM

## 2017-06-14 DIAGNOSIS — O99824 Streptococcus B carrier state complicating childbirth: Secondary | ICD-10-CM | POA: Diagnosis present

## 2017-06-14 DIAGNOSIS — B951 Streptococcus, group B, as the cause of diseases classified elsewhere: Secondary | ICD-10-CM

## 2017-06-14 DIAGNOSIS — O4290 Premature rupture of membranes, unspecified as to length of time between rupture and onset of labor, unspecified weeks of gestation: Secondary | ICD-10-CM | POA: Diagnosis present

## 2017-06-14 DIAGNOSIS — O4202 Full-term premature rupture of membranes, onset of labor within 24 hours of rupture: Secondary | ICD-10-CM | POA: Diagnosis not present

## 2017-06-14 MED ORDER — LACTATED RINGERS IV SOLN: 500.0000 mL | INTRAVENOUS | Status: DC | PRN

## 2017-06-14 MED ORDER — ACETAMINOPHEN 325 MG PO TABS: 650.0000 mg | ORAL_TABLET | ORAL | Status: DC | PRN

## 2017-06-14 MED ORDER — ONDANSETRON HCL 4 MG/2ML IJ SOLN: 4.0000 mg | Freq: Four times a day (QID) | INTRAMUSCULAR | Status: DC | PRN

## 2017-06-14 MED ORDER — SODIUM CHLORIDE 0.9 % IV SOLN
2.0000 g | Freq: Once | INTRAVENOUS | Status: AC
  Administered 2017-06-15: 2 g via INTRAVENOUS
  Filled 2017-06-14: qty 2000

## 2017-06-14 MED ORDER — SODIUM CHLORIDE 0.9 % IV SOLN: 1.0000 g | INTRAVENOUS | Status: DC

## 2017-06-14 MED ORDER — OXYTOCIN 40 UNITS IN LACTATED RINGERS INFUSION - SIMPLE MED
2.5000 [IU]/h | INTRAVENOUS | Status: DC
  Administered 2017-06-15: 2.5 [IU]/h via INTRAVENOUS
  Filled 2017-06-14: qty 1000

## 2017-06-14 MED ORDER — OXYTOCIN BOLUS FROM INFUSION
500.0000 mL | Freq: Once | INTRAVENOUS | Status: AC
  Administered 2017-06-15: 500 mL via INTRAVENOUS

## 2017-06-14 MED ORDER — FENTANYL CITRATE (PF) 100 MCG/2ML IJ SOLN
50.0000 ug | INTRAMUSCULAR | Status: DC | PRN
  Administered 2017-06-15: 100 ug via INTRAVENOUS
  Filled 2017-06-14: qty 2

## 2017-06-14 MED ORDER — SODIUM CHLORIDE 0.9 % IV SOLN
1.0000 g | INTRAVENOUS | Status: DC
  Administered 2017-06-15 (×2): 1 g via INTRAVENOUS
  Filled 2017-06-14 (×4): qty 1000

## 2017-06-14 MED ORDER — LIDOCAINE HCL (PF) 1 % IJ SOLN: 30.0000 mL | INTRAMUSCULAR | Status: DC | PRN

## 2017-06-14 MED ORDER — LACTATED RINGERS IV SOLN
INTRAVENOUS | Status: DC
  Administered 2017-06-15 (×2): via INTRAVENOUS

## 2017-06-14 MED ORDER — SOD CITRATE-CITRIC ACID 500-334 MG/5ML PO SOLN: 30.0000 mL | ORAL | Status: DC | PRN

## 2017-06-14 NOTE — H&P (Signed)
Obstetric History and Physical  Blaize Golding is a 42 y.o. G1P0 with IUP at 63w2dpresenting with SROM at 2215. Patient states she has been having  irregular, every couple of minutes contractions, minimal vaginal bleeding, ruptured, clear fluid membranes, with active fetal movement.    [facility] P[facility] complications or risks:AMA  Prenatal labs and studies: ABO, Rh: A/Positive/-- (10/09 1349) Antibody: Negative (10/09 1349) Rubella: 2.30 (10/09 1349) RPR: Non Reactive (01/31 1613)  HBsAg: Negative (10/09 1349)  HIV: Non Reactive (10/09 1349)  GOHY:WVPXTGGY(03/28 1606) 1 hr Glucola  Elevated with normal 3hGTT Genetic screening normal Anatomy [place]  Past Medical History:  Diagnosis Date  . Gall stones   . Iodine goiter   . Left ovarian cyst     Past Surgical History:  Procedure Laterality Date  . none      OB History  Gravida Para Term Preterm AB Living  1            SAB TAB Ectopic Multiple Live Births               # Outcome Date GA Lbr Len/2nd Weight Sex Delivery Anes PTL Lv  1 Current             Social History   Socioeconomic History  . Marital status: Married    Spouse name: Not on file  . Number of children: Not on file  . Years of education: Not on file  . Highest education level: Not on file  Occupational History  . Not on file  Social Needs  . Financial resource strain: Not on file  . Food insecurity:    Worry: Not on file    Inability: Not on file  . Transportation needs:    Medical: Not on file    Non-medical: Not on file  Tobacco Use  . Smoking status: Never Smoker  . Smokeless tobacco: Never Used  Substance and Sexual Activity  . Alcohol use: No    Comment: on occ before pregnant  . Drug use: No  . Sexual activity: Yes    Partners: Male  Lifestyle  . Physical activity:    Days per week: Not on file    Minutes per session: Not on file  . Stress: Not on file  Relationships  . Social  connections:    Talks on phone: Not on file    Gets together: Not on file    Attends religious service: Not on file    Active member of club or organization: Not on file    Attends meetings of clubs or organizations: Not on file    Relationship status: Not on file  Other Topics Concern  . Not on file  Social History Narrative  . Not on file    Family History  Problem Relation Age of Onset  . Hyperlipidemia Mother   . Hypertension Mother   . Migraines Mother   . Cancer Father        prostate  . Hypertension Father     Medications Prior to Admission  Medication Sig Dispense Refill Last Dose  . Misc. Devices (BREAST PUMP) MISC Dispense one breast pump for patient (Patient not taking: Reported on 06/09/2017) 1 each 0 Not Taking  . Prenatal Vit-Fe Fumarate-FA (PRENATAL MULTIVITAMIN) TABS tablet Take 1 tablet by mouth daily at 12 noon.   Taking    Allergies  Allergen Reactions  . Chromium Dermatitis, Hives, Itching and Rash  . Cobalt  Dermatitis, Hives, Itching and Rash  . Nickel Dermatitis, Hives, Itching and Rash  . Nitrofurantoin Nausea And Vomiting and Rash    Review of Systems: Negative except for what is mentioned in HPI.  Physical Exam: BP (!) 146/79   Pulse 80   Ht _0  (1.626 m)   Wt 145 lb (65.8 kg)   LMP 09/19/2016   BMI 24.89 kg/m  GENERAL: Well-developed, well-nourished female in no acute distress.  LUNGS: Clear to auscultation bilaterally.  HEART: Regular rate and rhythm. ABDOMEN: Soft, nontender, nondistended, gravid. EXTREMITIES: Nontender, no edema, 2+ distal pulses. Cervical Exam: Dilation: 1 Effacement (%): 80 Station: -2 Exam by:: [employer] RN FHT:  Baseline rate 121 bpm   Variability moderate  Accelerations present   Decelerations none Contractions: Every 3-5 mins   Pertinent Labs/Studies:   No results found for this or any previous visit (from the past 24 hour(s)).  Assessment : Aryianna Burgard is a 42 y.o. G1P0 at 51w2dbeing admitted  for labor.  Plan: Labor: Expectant management.  Induction/Augmentation as needed, per protocol FWB: Reassuring fetal heart tracing.  GBS positive Delivery plan: Hopeful for vaginal delivery, has cord blood donation kit  Melody Shambley, CNM [facility]

## 2017-06-14 NOTE — OB Triage Note (Signed)
Pt arrival to triage with complaint of SROM around 2215.  Contractions started after SROM.  Pt feeling baby move normally.  No vaginal bleeding.  EFM and toco applied and assessing.

## 2017-06-15 ENCOUNTER — Inpatient Hospital Stay: Payer: BLUE CROSS/BLUE SHIELD | Admitting: Anesthesiology

## 2017-06-15 ENCOUNTER — Encounter: Payer: Self-pay | Admitting: Anesthesiology

## 2017-06-15 DIAGNOSIS — Z3A38 38 weeks gestation of pregnancy: Secondary | ICD-10-CM

## 2017-06-15 DIAGNOSIS — O4202 Full-term premature rupture of membranes, onset of labor within 24 hours of rupture: Secondary | ICD-10-CM

## 2017-06-15 LAB — CBC
HCT: 40.8 % (ref 35.0–47.0)
HEMOGLOBIN: 13.7 g/dL (ref 12.0–16.0)
MCH: 30.6 pg (ref 26.0–34.0)
MCHC: 33.6 g/dL (ref 32.0–36.0)
MCV: 91.1 fL (ref 80.0–100.0)
PLATELETS: 278 10*3/uL (ref 150–440)
RBC: 4.48 MIL/uL (ref 3.80–5.20)
RDW: 13.7 % (ref 11.5–14.5)
WBC: 13.3 10*3/uL — ABNORMAL HIGH (ref 3.6–11.0)

## 2017-06-15 LAB — TYPE AND SCREEN
ABO/RH(D): A POS
Antibody Screen: NEGATIVE

## 2017-06-15 MED ORDER — PHENYLEPHRINE 40 MCG/ML (10ML) SYRINGE FOR IV PUSH (FOR BLOOD PRESSURE SUPPORT)
80.0000 ug | PREFILLED_SYRINGE | INTRAVENOUS | Status: DC | PRN
  Filled 2017-06-15: qty 5

## 2017-06-15 MED ORDER — BENZOCAINE-MENTHOL 20-0.5 % EX AERO
1.0000 "application " | INHALATION_SPRAY | CUTANEOUS | Status: DC | PRN
  Administered 2017-06-17: 1 via TOPICAL
  Filled 2017-06-15: qty 56

## 2017-06-15 MED ORDER — LACTATED RINGERS IV SOLN
500.0000 mL | Freq: Once | INTRAVENOUS | Status: AC
  Administered 2017-06-15: 500 mL via INTRAVENOUS

## 2017-06-15 MED ORDER — OXYCODONE HCL 5 MG PO TABS: 10.0000 mg | ORAL_TABLET | ORAL | Status: DC | PRN

## 2017-06-15 MED ORDER — ONDANSETRON HCL 4 MG/2ML IJ SOLN: 4.0000 mg | INTRAMUSCULAR | Status: DC | PRN

## 2017-06-15 MED ORDER — IBUPROFEN 600 MG PO TABS
600.0000 mg | ORAL_TABLET | Freq: Four times a day (QID) | ORAL | Status: DC
  Administered 2017-06-15 – 2017-06-17 (×7): 600 mg via ORAL
  Filled 2017-06-15 (×7): qty 1

## 2017-06-15 MED ORDER — ONDANSETRON HCL 4 MG PO TABS: 4.0000 mg | ORAL_TABLET | ORAL | Status: DC | PRN

## 2017-06-15 MED ORDER — DIBUCAINE 1 % RE OINT
TOPICAL_OINTMENT | RECTAL | Status: AC
  Filled 2017-06-15: qty 28

## 2017-06-15 MED ORDER — DOCUSATE SODIUM 100 MG PO CAPS
100.0000 mg | ORAL_CAPSULE | Freq: Two times a day (BID) | ORAL | Status: DC
  Administered 2017-06-16 – 2017-06-17 (×4): 100 mg via ORAL
  Filled 2017-06-15 (×4): qty 1

## 2017-06-15 MED ORDER — LIDOCAINE-EPINEPHRINE (PF) 1.5 %-1:200000 IJ SOLN
INTRAMUSCULAR | Status: DC | PRN
  Administered 2017-06-15: 3 mL via EPIDURAL

## 2017-06-15 MED ORDER — COCONUT OIL OIL
1.0000 "application " | TOPICAL_OIL | Status: DC | PRN
  Filled 2017-06-15: qty 120

## 2017-06-15 MED ORDER — PRENATAL MULTIVITAMIN CH
1.0000 | ORAL_TABLET | Freq: Every day | ORAL | Status: DC
  Administered 2017-06-16 – 2017-06-17 (×2): 1 via ORAL
  Filled 2017-06-15 (×2): qty 1

## 2017-06-15 MED ORDER — EPHEDRINE 5 MG/ML INJ
10.0000 mg | INTRAVENOUS | Status: DC | PRN
  Filled 2017-06-15: qty 2

## 2017-06-15 MED ORDER — DIPHENHYDRAMINE HCL 50 MG/ML IJ SOLN: 12.5000 mg | INTRAMUSCULAR | Status: DC | PRN

## 2017-06-15 MED ORDER — WITCH HAZEL-GLYCERIN EX PADS
1.0000 "application " | MEDICATED_PAD | CUTANEOUS | Status: DC | PRN
  Administered 2017-06-17: 1 via TOPICAL

## 2017-06-15 MED ORDER — DIPHENHYDRAMINE HCL 25 MG PO CAPS: 25.0000 mg | ORAL_CAPSULE | Freq: Four times a day (QID) | ORAL | Status: DC | PRN

## 2017-06-15 MED ORDER — DIBUCAINE 1 % RE OINT
TOPICAL_OINTMENT | RECTAL | Status: DC | PRN
  Administered 2017-06-15: 16:00:00 via RECTAL
  Filled 2017-06-15: qty 28

## 2017-06-15 MED ORDER — FENTANYL 2.5 MCG/ML W/ROPIVACAINE 0.15% IN NS 100 ML EPIDURAL (ARMC)
12.0000 mL/h | EPIDURAL | Status: DC
  Administered 2017-06-15 (×2): 12 mL/h via EPIDURAL
  Filled 2017-06-15: qty 100

## 2017-06-15 MED ORDER — MISOPROSTOL 200 MCG PO TABS
ORAL_TABLET | ORAL | Status: AC
  Filled 2017-06-15: qty 4

## 2017-06-15 MED ORDER — OXYTOCIN 10 UNIT/ML IJ SOLN
INTRAMUSCULAR | Status: AC
  Filled 2017-06-15: qty 2

## 2017-06-15 MED ORDER — OXYCODONE HCL 5 MG PO TABS: 5.0000 mg | ORAL_TABLET | ORAL | Status: DC | PRN

## 2017-06-15 MED ORDER — FENTANYL 2.5 MCG/ML W/ROPIVACAINE 0.15% IN NS 100 ML EPIDURAL (ARMC)
EPIDURAL | Status: AC
  Filled 2017-06-15: qty 100

## 2017-06-15 MED ORDER — OXYTOCIN 40 UNITS IN LACTATED RINGERS INFUSION - SIMPLE MED
1.0000 m[IU]/min | INTRAVENOUS | Status: DC
  Administered 2017-06-15: 2 m[IU]/min via INTRAVENOUS

## 2017-06-15 MED ORDER — DIBUCAINE 1 % RE OINT
1.0000 "application " | TOPICAL_OINTMENT | RECTAL | Status: DC | PRN
  Administered 2017-06-17: 1 via RECTAL

## 2017-06-15 MED ORDER — TERBUTALINE SULFATE 1 MG/ML IJ SOLN: 0.2500 mg | Freq: Once | INTRAMUSCULAR | Status: DC | PRN

## 2017-06-15 MED ORDER — BENZOCAINE-MENTHOL 20-0.5 % EX AERO
INHALATION_SPRAY | CUTANEOUS | Status: AC
  Filled 2017-06-15: qty 56

## 2017-06-15 MED ORDER — WITCH HAZEL-GLYCERIN EX PADS
MEDICATED_PAD | CUTANEOUS | Status: DC | PRN
  Administered 2017-06-15: 16:00:00 via TOPICAL
  Filled 2017-06-15 (×2): qty 100

## 2017-06-15 MED ORDER — SODIUM CHLORIDE 0.9 % IV SOLN
INTRAVENOUS | Status: DC | PRN
  Administered 2017-06-15 (×2): 5 mL via EPIDURAL

## 2017-06-15 MED ORDER — ACETAMINOPHEN 325 MG PO TABS
650.0000 mg | ORAL_TABLET | ORAL | Status: DC | PRN
  Administered 2017-06-15: 650 mg via ORAL
  Filled 2017-06-15: qty 2

## 2017-06-15 MED ORDER — LIDOCAINE HCL (PF) 1 % IJ SOLN
INTRAMUSCULAR | Status: AC
  Filled 2017-06-15: qty 30

## 2017-06-15 MED ORDER — SIMETHICONE 80 MG PO CHEW: 80.0000 mg | CHEWABLE_TABLET | ORAL | Status: DC | PRN

## 2017-06-15 MED ORDER — LIDOCAINE HCL (PF) 1 % IJ SOLN
INTRAMUSCULAR | Status: DC | PRN
  Administered 2017-06-15: 1 mL via INTRADERMAL

## 2017-06-15 MED ORDER — AMMONIA AROMATIC IN INHA
RESPIRATORY_TRACT | Status: AC
  Filled 2017-06-15: qty 10

## 2017-06-15 NOTE — Anesthesia Procedure Notes (Signed)
Epidural Patient location during procedure: OB Start time: 06/15/2017 2:45 AM End time: 06/15/2017 2:49 AM  Staffing Anesthesiologist: Naelani Lafrance, Precious Haws, MD Performed: anesthesiologist   Preanesthetic Checklist Completed: patient identified, site marked, surgical consent, pre-op evaluation, timeout performed, IV checked, risks and benefits discussed and monitors and equipment checked  Epidural Patient position: sitting Prep: ChloraPrep Patient monitoring: heart rate, continuous pulse ox and blood pressure Approach: midline Location: L3-L4 Injection technique: LOR saline  Needle:  Needle type: Tuohy  Needle gauge: 17 G Needle length: 9 cm and 9 Needle insertion depth: 5 cm Catheter type: closed end flexible Catheter size: 19 Gauge Catheter at skin depth: 10 cm Test dose: negative and 1.5% lidocaine with Epi 1:200 K  Assessment Sensory level: T10 Events: blood not aspirated, injection not painful, no injection resistance, negative IV test and no paresthesia  Additional Notes 1 attempt Pt. Evaluated and documentation done after procedure finished. Patient identified. Risks/Benefits/Options discussed with patient including but not limited to bleeding, infection, nerve damage, paralysis, failed block, incomplete pain control, headache, blood pressure changes, nausea, vomiting, reactions to medication both or allergic, itching and postpartum back pain. Confirmed with bedside nurse the patient's most recent platelet count. Confirmed with patient that they are not currently taking any anticoagulation, have any bleeding history or any family history of bleeding disorders. Patient expressed understanding and wished to proceed. All questions were answered. Sterile technique was used throughout the entire procedure. Please see nursing notes for vital signs. Test dose was given through epidural catheter and negative prior to continuing to dose epidural or start infusion. Warning signs of  high block given to the patient including shortness of breath, tingling/numbness in hands, complete motor block, or any concerning symptoms with instructions to call for help. Patient was given instructions on fall risk and not to get out of bed. All questions and concerns addressed with instructions to call with any issues or inadequate analgesia.   Patient tolerated the insertion well without immediate complications.Reason for block:procedure for pain

## 2017-06-15 NOTE — Progress Notes (Signed)
Kim Conner is a 42 y.o. G1P0 at [redacted]w[redacted]d by LMP admitted for active labor, rupture of membranes  Subjective: Reports lower pelvic pressure with reducing epidural rate, also feels exhausted.  Objective: BP (!) 152/89   Pulse 84   Temp 98.6 F (37 C) (Oral)   Resp 18   Ht 5\' 4"  (1.626 m)   Wt 145 lb (65.8 kg)   LMP 09/19/2016   SpO2 100%   BMI 24.89 kg/m  I/O last 3 completed shifts: In: 500 [I.V.:500] Out: -  Total I/O In: -  Out: 0932 [Urine:1875]  FHT:  FHR: 148 bpm, variability: moderate,  accelerations:  Present,  decelerations:  Absent UC:   irregular, every 3-6 minutes, mild to palpation and little change with maternal pushing effort. SVE:   Dilation: 10 Effacement (%): 100 Station: 0 Exam by:: P Funk RN No mov't of fetal head with pushing due to poor maternal effort, ROA position noted and mild caput anteriorly Labs: Lab Results  Component Value Date   WBC 13.3 (H) 06/14/2017   HGB 13.7 06/14/2017   HCT 40.8 06/14/2017   MCV 91.1 06/14/2017   PLT 278 06/14/2017    Assessment / Plan: protracted second stage, ineffective uterine tone and maternal effort. reviewed with Dr Amalia Hailey. reviewed with patient and support persons, and patient is agreeable to plan of care.  Labor: will add pitocin to increase uterine strength with contractions, and have mom push every other contraction for now to reserve maternal strength. Preeclampsia:  labs stable Fetal Wellbeing:  Category I Pain Control:  Epidural I/D:  Kim Conner/a Anticipated MOD:  NSVD  Kim Conner Kim Conner Kim Conner 06/15/2017, 9:28 AM

## 2017-06-15 NOTE — Progress Notes (Signed)
Kim Conner is a 42 y.o. G1P0 at [redacted]w[redacted]d by LMP admitted for active labor  Subjective: denies pain since epidural placed  Objective: BP 129/76   Pulse 91   Temp 98.1 F (36.7 C) (Oral)   Resp 20   Ht 5\' 4"  (1.626 m)   Wt 145 lb (65.8 kg)   LMP 09/19/2016   BMI 24.89 kg/m  No intake/output data recorded. Total I/O In: 500 [I.V.:500] Out: -   FHT:  FHR: 145 bpm, variability: moderate,  accelerations:  Present,  decelerations:  Absent UC:   irregular, every 2-4 minutes SVE:   Dilation: Lip/rim Effacement (%): 100 Station: 0, Plus 1 Exam by:: [employer] RN  Labs: Lab Results  Component Value Date   WBC 13.3 (H) 06/14/2017   HGB 13.7 06/14/2017   HCT 40.8 06/14/2017   MCV 91.1 06/14/2017   PLT 278 06/14/2017    Assessment / Plan: Spontaneous labor, progressing normally  Labor: Progressing normally Preeclampsia:  labs stable Fetal Wellbeing:  Category I Pain Control:  Epidural I/D:  Kim/a Anticipated MOD:  NSVD  Kim Conner Kim Conner 06/15/2017, 5:23 AM

## 2017-06-15 NOTE — Anesthesia Preprocedure Evaluation (Signed)
Anesthesia Evaluation  Patient identified by MRN, date of birth, ID band Patient awake    Reviewed: Allergy & Precautions, H&P , NPO status , Patient's Chart, lab work & pertinent test results  History of Anesthesia Complications Negative for: history of anesthetic complications  Airway Mallampati: II  TM Distance: >3 FB Neck ROM: full    Dental  (+) Chipped   Pulmonary neg pulmonary ROS,           Cardiovascular Exercise Tolerance: Good (-) hypertensionnegative cardio ROS       Neuro/Psych    GI/Hepatic negative GI ROS,   Endo/Other    Renal/GU   negative genitourinary   Musculoskeletal   Abdominal   Peds  Hematology negative hematology ROS (+)   Anesthesia Other Findings Patient reports no problems with medical needles triggering her allergies in the past.   Past Medical History: No date: Gall stones No date: Iodine goiter No date: Left ovarian cyst  Past Surgical History: No date: none  BMI    Body Mass Index:  24.89 kg/m      Reproductive/Obstetrics (+) Pregnancy                             Anesthesia Physical Anesthesia Plan  ASA: II  Anesthesia Plan: Epidural   Post-op Pain Management:    Induction:   PONV Risk Score and Plan:   Airway Management Planned:   Additional Equipment:   Intra-op Plan:   Post-operative Plan:   Informed Consent: I have reviewed the patients History and Physical, chart, labs and discussed the procedure including the risks, benefits and alternatives for the proposed anesthesia with the patient or authorized representative who has indicated his/her understanding and acceptance.     Plan Discussed with: Anesthesiologist  Anesthesia Plan Comments:         Anesthesia Quick Evaluation

## 2017-06-16 LAB — CBC
HCT: 31.3 % — ABNORMAL LOW (ref 35.0–47.0)
Hemoglobin: 10.6 g/dL — ABNORMAL LOW (ref 12.0–16.0)
MCH: 31.1 pg (ref 26.0–34.0)
MCHC: 33.7 g/dL (ref 32.0–36.0)
MCV: 92.3 fL (ref 80.0–100.0)
PLATELETS: 250 10*3/uL (ref 150–440)
RBC: 3.39 MIL/uL — ABNORMAL LOW (ref 3.80–5.20)
RDW: 13.9 % (ref 11.5–14.5)
WBC: 28.6 10*3/uL — ABNORMAL HIGH (ref 3.6–11.0)

## 2017-06-16 LAB — RPR: RPR Ser Ql: NONREACTIVE

## 2017-06-16 NOTE — Lactation Note (Signed)
This note was copied from a baby's chart. Lactation Consultation Note  Patient Name: Kim Conner [id number] Date: 06/16/2017 Reason for consult: Follow-up assessment;Primapara   Maternal Data Formula Feeding for Exclusion: No Has patient been taught Hand Expression?: Yes Does the patient have breastfeeding experience prior to this delivery?: No  Feeding Feeding Type: Breast Fed Length of feed: 25 min(15 min. right, 10 min left)  LATCH Score Latch: Repeated attempts needed to sustain latch, nipple held in mouth throughout feeding, stimulation needed to elicit sucking reflex.  Audible Swallowing: Spontaneous and intermittent  Type of Nipple: Everted at rest and after stimulation  Comfort (Breast/Nipple): Soft / non-tender  Hold (Positioning): Assistance needed to correctly position infant at breast and maintain latch.  LATCH Score: 8  Interventions Interventions: Breast feeding basics reviewed;Assisted with latch;Hand express;Breast compression;Adjust position  Lactation Tools Discussed/Used WIC Program: No   Consult Status Consult Status: PRN    [name] 06/16/2017, 1:37 PM

## 2017-06-16 NOTE — Anesthesia Postprocedure Evaluation (Signed)
Anesthesia Post Note  Patient: Kim Conner  Procedure(s) Performed: AN AD HOC LABOR EPIDURAL  Patient location during evaluation: Mother Baby Anesthesia Type: Epidural Level of consciousness: awake, awake and alert and oriented Pain management: pain level controlled Vital Signs Assessment: post-procedure vital signs reviewed and stable Respiratory status: spontaneous breathing, respiratory function stable and nonlabored ventilation Cardiovascular status: stable Postop Assessment: no headache Anesthetic complications: no     Last Vitals:  Vitals:   06/16/17 0507 06/16/17 0811  BP: 97/71 111/74  Pulse: 74 79  Resp: 20 20  Temp: 36.9 C 37.1 C  SpO2: 100% 99%    Last Pain:  Vitals:   06/16/17 0811  TempSrc: Oral  PainSc:                  Lance Muss

## 2017-06-16 NOTE — Progress Notes (Signed)
Post Partum Day 1 Subjective: no complaints, up ad lib and voiding  Objective: Blood pressure 111/74, pulse 79, temperature 98.8 F (37.1 C), temperature source Oral, resp. rate 20, height 5\' 4"  (1.626 m), weight 145 lb (65.8 kg), last menstrual period 09/19/2016, SpO2 99 %, unknown if currently breastfeeding.  Physical Exam:  General: alert, cooperative and appears stated age Lochia: appropriate Uterine Fundus: firm Incision: NA DVT Evaluation: No evidence of DVT seen on physical exam. Negative Homan's sign.  Recent Labs    06/14/17 2341 06/16/17 0556  HGB 13.7 10.6*  HCT 40.8 31.3*    Assessment/Plan: Plan for discharge tomorrow, Breastfeeding and Circumcision prior to discharge Infant feeding Breast;     LOS: 2 days   Kim Conner 06/16/2017, 9:44 AM

## 2017-06-17 ENCOUNTER — Encounter: Payer: BLUE CROSS/BLUE SHIELD | Admitting: Obstetrics and Gynecology

## 2017-06-17 MED ORDER — FUSION PLUS PO CAPS: 1.0000 | ORAL_CAPSULE | Freq: Every day | ORAL | 1 refills | Status: DC

## 2017-06-17 MED ORDER — VITAMIN D3 125 MCG (5000 UT) PO CAPS: 1.0000 | ORAL_CAPSULE | Freq: Every day | ORAL | 2 refills | Status: AC

## 2017-06-17 NOTE — Discharge Summary (Signed)
Obstetric Discharge Summary Reason for Admission: onset of labor Prenatal Procedures: ultrasound Intrapartum Procedures: spontaneous vaginal delivery and GBS prophylaxis Postpartum Procedures: none Complications-Operative and Postpartum: none  Delivery Note At 2:50 PM a viable and healthy female was delivered via Vaginal, Spontaneous (Presentation: LOP;  ).  APGAR: 6, 8; weight 7 lb 2.6 oz (3250 g).     Anesthesia:  epidural Episiotomy: Median Lacerations:  none Suture Repair: 3.0 vicryl rapide Est. Blood Loss (mL): 400  Mom to postpartum.  Baby to Couplet care / Skin to Skin.  Kim Conner N Kim Conner 06/17/2017, 6:58 PM  .     H/H:  Lab Results  Component Value Date/Time   HGB 10.6 (L) 06/16/2017 05:56 AM   HGB 12.3 04/07/2017 04:13 PM   HCT 31.3 (L) 06/16/2017 05:56 AM   HCT 38.1 04/07/2017 04:13 PM    Discharge Diagnoses: Term Pregnancy-delivered  Discharge Information: Date: 06/17/2017 Activity: pelvic rest Diet: routine Baby feeding: plans to breastfeed Contraception: condoms Medications: PNV, Ibuprofen, Colace and Iron Condition: stable Instructions: refer to practice specific booklet Discharge to: home   Kim Conner,CNM 06/17/2017,6:58 PM

## 2017-06-17 NOTE — Progress Notes (Signed)
All discharge instructions given to patient and she voices understanding of all instructions given. She knows to make a f/u appt for 6 wk check up at [facility]. Patient discharged home with spouse and baby escorted out in wheelchair by cna

## 2017-06-17 NOTE — Progress Notes (Signed)
Post Partum Day 2 Subjective: no complaints and up ad lib  Objective: Blood pressure 117/79, pulse 79, temperature 98 F (36.7 C), temperature source Oral, resp. rate 20, height 5\' 4"  (1.626 m), weight 145 lb (65.8 kg), last menstrual period 09/19/2016, SpO2 98 %, unknown if currently breastfeeding.  Physical Exam:  General: alert, cooperative and appears stated age Lochia: appropriate Uterine Fundus: firm Incision: NA DVT Evaluation: No evidence of DVT seen on physical exam. Negative Homan's sign. Calf/Ankle edema is present.  Recent Labs    06/14/17 2341 06/16/17 0556  HGB 13.7 10.6*  HCT 40.8 31.3*    Assessment/Plan: Discharge home and Breastfeeding (will room in due to infant staying under bili lights) Infant feeding Breast;    LOS: 3 days   Melody N Shambley 06/17/2017, 10:06 AM

## 2017-06-17 NOTE — Discharge Instructions (Signed)
Follow up sooner with fever, problems breathing, pain not helped by medications, severe depression( more than just baby blues, wanting to hurt yourself or the baby), severe bleeding ( saturating more than one pad an hour or large palm sized clots), no heavy lifting , no driving while taking narcotics, no douches, intercourse, tampons or enemas for 6 weeks

## 2017-06-17 NOTE — Progress Notes (Signed)
Pt alert , oriented, vitals stable, up to bathroom independently. Good appetite, voiding adequately. Receiving  Motrin on schedule with positive results. Possible d/c today.

## 2017-07-02 ENCOUNTER — Encounter: Payer: Self-pay | Admitting: Obstetrics and Gynecology

## 2017-07-04 ENCOUNTER — Ambulatory Visit (INDEPENDENT_AMBULATORY_CARE_PROVIDER_SITE_OTHER): Payer: BLUE CROSS/BLUE SHIELD | Admitting: Certified Nurse Midwife

## 2017-07-04 ENCOUNTER — Encounter: Payer: Self-pay | Admitting: Certified Nurse Midwife

## 2017-07-04 VITALS — BP 136/77 | HR 83 | Wt 127.5 lb

## 2017-07-04 DIAGNOSIS — G8918 Other acute postprocedural pain: Secondary | ICD-10-CM

## 2017-07-04 DIAGNOSIS — N898 Other specified noninflammatory disorders of vagina: Secondary | ICD-10-CM

## 2017-07-04 DIAGNOSIS — O9089 Other complications of the puerperium, not elsewhere classified: Secondary | ICD-10-CM

## 2017-07-04 MED ORDER — FLUCONAZOLE 150 MG PO TABS: 150.0000 mg | ORAL_TABLET | ORAL | 3 refills | Status: DC

## 2017-07-04 NOTE — Patient Instructions (Addendum)
Breast Pumping Tips If you are breastfeeding, there may be times when you cannot feed your baby directly. Returning to work or going on a trip are common examples. Pumping allows you to store breast milk and feed it to your baby later. You may not get much milk when you first start to pump. Your breasts should start to make more after a few days. If you pump at the times you usually feed your baby, you may be able to keep making enough milk to feed your baby without also using formula. The more often you pump, the more milk you will produce. When should I pump?  You can begin to pump soon after delivery. However, some experts recommend waiting about 4 weeks before giving your infant a bottle to make sure breastfeeding is going well.  If you plan to return to work, begin pumping a few weeks before. This will help you develop techniques that work best for you. It also lets you build up a supply of breast milk.  When you are with your infant, feed on demand and pump after each feeding.  When you are away from your infant for several hours, pump for about 15 minutes every 2-3 hours. Pump both breasts at the same time if you can.  If your infant has a formula feeding, make sure to pump around the same time.  If you drink any alcohol, wait 2 hours before pumping. How do I prepare to pump? Your let-down reflexis the natural reaction to stimulation that makes your breast milk flow. It is easier to stimulate this reflex when you are relaxed. Find relaxation techniques that work for you. If you have difficulty with your let-down reflex, try these methods:  Smell one of your infant's blankets or an item of clothing.  Look at a picture or video of your infant.  Sit in a quiet, private space.  Massage the breast you plan to pump.  Place soothing warmth on the breast.  Play relaxing music.  What are some general breast pumping tips?  Wash your hands before you pump. You do not need to wash your  nipples or breasts.  There are three ways to pump. ? You can use your hand to massage and compress your breast. ? You can use a handheld manual pump. ? You can use an electric pump.  Make sure the suction cup (flange) on the breast pump is the right size. Place the flange directly over the nipple. If it is the wrong size or placed the wrong way, it may be painful and cause nipple damage.  If pumping is uncomfortable, apply a small amount of purified or modified lanolin to your nipple and areola.  If you are using an electric pump, adjust the speed and suction power to be more comfortable.  If pumping is painful or if you are not getting very much milk, you may need a different type of pump. A lactation consultant can help you determine what type of pump to use.  Keep a full water bottle near you at all times. Drinking lots of fluid helps you make more milk.  You can store your milk to use later. Pumped breast milk can be stored in a sealable, sterile container or plastic bag. Label all stored breast milk with the date you pumped it. ? Milk can stay out at room temperature for up to 8 hours. ? You can store your milk in the refrigerator for up to 8 days. ? You can  store your milk in the freezer for 3 months. Thaw frozen milk using warm water. Do not put it in the microwave.  Do not smoke. Smoking can lower your milk supply and harm your infant. If you need help quitting, ask your health care provider to recommend a program. When should I call my health care provider or a lactation consultant?  You are having trouble pumping.  You are concerned that you are not making enough milk.  You have nipple pain, soreness, or redness.  You want to use birth control. Birth control pills may lower your milk supply. Talk to your health care provider about your options. This information is not intended to replace advice given to you by your health care provider. Make sure you discuss any questions  you have with your health care provider. Document Released: 08/12/2009 Document Revised: 08/06/2015 Document Reviewed: 12/15/2012 Elsevier Interactive Patient Education  2017 [employer] Fluconazole tablets What is this medicine? FLUCONAZOLE (floo KON na zole) is an antifungal medicine. It is used to treat certain kinds of fungal or yeast infections. This medicine may be used for other purposes; ask your health care provider or pharmacist if you have questions. COMMON BRAND NAME(S): Diflucan What should I tell my health care provider before I take this medicine? They need to know if you have any of these conditions: -history of irregular heart beat -kidney disease -an unusual or allergic reaction to fluconazole, other azole antifungals, medicines, foods, dyes, or preservatives -pregnant or trying to get pregnant -breast-feeding How should I use this medicine? Take this medicine by mouth. Follow the directions on the prescription label. Do not take your medicine more often than directed. Talk to your pediatrician regarding the use of this medicine in children. Special care may be needed. This medicine has been used in children as young as 91 months of age. Overdosage: If you think you have taken too much of this medicine contact a poison control center or emergency room at once. NOTE: This medicine is only for you. Do not share this medicine with others. What if I miss a dose? If you miss a dose, take it as soon as you can. If it is almost time for your next dose, take only that dose. Do not take double or extra doses. What may interact with this medicine? Do not take this medicine with any of the following medications: -astemizole -certain medicines for irregular heart beat like dofetilide, dronedarone, quinidine -cisapride -erythromycin -lomitapide -other medicines that prolong the QT interval (cause an abnormal heart  rhythm) -pimozide -terfenadine -thioridazine -tolvaptan -ziprasidone This medicine may also interact with the following medications: -antiviral medicines for HIV or AIDS -birth control pills -certain antibiotics like rifabutin, rifampin -certain medicines for blood pressure like amlodipine, isradipine, felodipine, hydrochlorothiazide, losartan, nifedipine -certain medicines for cancer like cyclophosphamide, vinblastine, vincristine -certain medicines for cholesterol like atorvastatin, lovastatin, fluvastatin, simvastatin -certain medicines for depression, anxiety, or psychotic disturbances like amitriptyline, midazolam, nortriptyline, triazolam -certain medicines for diabetes like glipizide, glyburide, tolbutamide -certain medicines for pain like alfentanil, fentanyl, methadone -certain medicines for seizures like carbamazepine, phenytoin -certain medicines that treat or prevent blood clots like warfarin -halofantrine -medicines that lower your chance of fighting infection like cyclosporine, prednisone, tacrolimus -NSAIDS, medicines for pain and inflammation, like celecoxib, diclofenac, flurbiprofen, ibuprofen, meloxicam, naproxen -other medicines for fungal infections -sirolimus -theophylline -tofacitinib This list may not describe all possible interactions. Give your health care provider a list of all the medicines, herbs, non-prescription drugs, or dietary supplements you  use. Also tell them if you smoke, drink alcohol, or use illegal drugs. Some items may interact with your medicine. What should I watch for while using this medicine? Visit your doctor or health care professional for regular checkups. If you are taking this medicine for a long time you may need blood work. Tell your doctor if your symptoms do not improve. Some fungal infections need many weeks or months of treatment to cure. Alcohol can increase possible damage to your liver. Avoid alcoholic drinks. If you have a  vaginal infection, do not have sex until you have finished your treatment. You can wear a sanitary napkin. Do not use tampons. Wear freshly washed cotton, not synthetic, panties. What side effects may I notice from receiving this medicine? Side effects that you should report to your doctor or health care professional as soon as possible: -allergic reactions like skin rash or itching, hives, swelling of the lips, mouth, tongue, or throat -dark urine -feeling dizzy or faint -irregular heartbeat or chest pain -redness, blistering, peeling or loosening of the skin, including inside the mouth -trouble breathing -unusual bruising or bleeding -vomiting -yellowing of the eyes or skin Side effects that usually do not require medical attention (report to your doctor or health care professional if they continue or are bothersome): -changes in how food tastes -diarrhea -headache -stomach upset or nausea This list may not describe all possible side effects. Call your doctor for medical advice about side effects. You may report side effects to FDA at 1-800-FDA-1088. Where should I keep my medicine? Keep out of the reach of children. Store at room temperature below 30 degrees C (86 degrees F). Throw away any medicine after the expiration date. NOTE: This sheet is a summary. It may not cover all possible information. If you have questions about this medicine, talk to your doctor, pharmacist, or health care provider.  2018 Elsevier/Gold Standard (2012-09-30 19:37:38) Care of a Perineal Tear A perineal tear is a cut (laceration) in the tissue between the opening of the vagina and the anus (perineum). Some women naturally develop a perineal tear during a vaginal birth. This can happen as the baby emerges from the birth canal and the perineum is stretched. Perineal tears are graded based on how deep and long the laceration is. The grading for perineal tears is as follows:  First degree. This involves a  shallow tear at the edge of the vaginal opening that extends slightly into the perineal skin.  Second degree. This involves tearing described in a first degree perineal tear and also a deeper tear of the vaginal opening and perineal tissues. It may also include tearing of a muscle just under the perineal skin.  Third degree. This involves tearing described in a first and second degree perineal tear, with the tear extending into the muscle of the anus (anal sphincter).  Fourth degree. This involves all levels of tear described for first, second, and third degree perineal tear, with the tear extending into the rectum.  First degree perineal tears may or may not be stitched closed, depending on their location and appearance. Second, third, and fourth degree perineal tears are stitched closed immediately after the baby's birth. What are the risks? Depending on the type of perineal tear you have, you may be at risk for the following:  Bleeding.  Developing a collection of blood in the perineal tear area (hematoma).  Pain. This may include pain with urination or bowel movements.  Infection at the site of the tear.  Fever.  Trouble controlling your bowels (fecal incontinence).  Painful sexual intercourse.  How to care for a perineal tear  The first day, put ice on the area of the tear. ? Put ice in a plastic bag. ? Place a towel between your skin and the bag. ? Leave the ice on for 20 minutes, 2-3 times a day.  Bathe using a warm sitz bath as directed by your health care provider. This can speed up healing. Sitz baths can be performed in your bathtub or using a sitz bath kit that fits over your toilet. ? Place 3-4 in. (7.6-10 cm) of warm water in your bathtub or fill the sitz bath over-the-toilet container with warm water. Make sure the water is not too hot by placing a drop on your wrist. ? Sit in the warm water for 20-30 minutes. ? After bathing, pat your perineum dry with a clean  towel. Do not scrub the perineum as this could cause pain, irritation, or open any stitches you may have. ? Keep the over-the-toilet sitz bath container clean by rinsing it thoroughly after each use. Ask for help in keeping the bathtub clean with diluted bleach and water (2 Tbsp [30 mL] of bleach to  gal [1.9 L] of water). ? Repeat the sitz bath as often as you would like to relieve perineal pain, itching, or discomfort.  Apply a numbing spray to the perineal tear site as directed by your health care provider. This may help with discomfort.  Wash your hands before and after applying medicine to the area.  Put about 3 witch hazel-containing hemorrhoid treatment pads on top of your sanitary pad. The witch hazel in the hemorrhoid pads helps with discomfort and swelling.  Get a squeeze bottle to squeeze warm water on your perineum when urinating, spraying the area from front to back. Pat the area to dry it.  Sitting on an inflatable ring or pillow may provide comfort.  Take medicines only as directed by your health care provider.  Do not have sexual intercourse or use tampons until your health care provider says it is okay. Typically, you must wait at least 6 weeks.  Keep all postpartum appointments as directed by your health care provider. Contact a health care provider if:  Your pain is not relieved with medicines.  You have painful urination.  You have a fever. Get help right away if:  You have redness, swelling, or increasing pain in the area of the tear.  You have pus coming from the area of the tear.  You notice a bad smell coming from the area of the tear.  Your tear opens.  You notice swelling in the area of the tear that is larger than when you left the hospital.  You cannot urinate. This information is not intended to replace advice given to you by your health care provider. Make sure you discuss any questions you have with your health care provider. Document Released:  07/09/2013 Document Revised: 08/06/2015 Document Reviewed: 11/28/2012 Elsevier Interactive Patient Education  2017 [employer].

## 2017-07-04 NOTE — Progress Notes (Signed)
GYN ENCOUNTER NOTE  Subjective:       Kim Conner is a 42 y.o. G49P1001 female is here for gynecologic evaluation of the following issues:  1. Episiotomy pain 2. Vaginal itching  Reports symptoms for the last week. No relief with home treatment measures. Concerned about possible infection.   Denies difficulty breathing or respiratory distress, chest pain, fever, chills, foul odor, excessive vaginal bleeding, dysuria, and leg pain or swelling.    Gynecologic History  No LMP recorded. Recently pregnant. Breastfeeding.  Contraception: abstinence  Last Pap: 12/2016. Results: normal, history abnormal.   Obstetric History  OB History  Gravida Para Term Preterm AB Living  1 1 1     1   SAB TAB Ectopic Multiple Live Births        0 1    # Outcome Date GA Lbr Len/2nd Weight Sex Delivery Anes PTL Lv  1 Term 06/15/17 [redacted]w[redacted]d / 09:30 7 lb 2.6 oz (3.25 kg) M Vag-Spont EPI, Local  LIV    Past Medical History:  Diagnosis Date  . Gall stones   . Iodine goiter   . Left ovarian cyst     Past Surgical History:  Procedure Laterality Date  . none      Current Outpatient Medications on File Prior to Visit  Medication Sig Dispense Refill  . Cholecalciferol (VITAMIN D3) 5000 units CAPS Take 1 capsule (5,000 Units total) by mouth daily. 120 capsule 2  . Iron-FA-B Cmp-C-Biot-Probiotic (FUSION PLUS) CAPS Take 1 capsule by mouth daily. 60 capsule 1   No current facility-administered medications on file prior to visit.     Allergies  Allergen Reactions  . Chromium Dermatitis, Hives, Itching and Rash  . Cobalt Dermatitis, Hives, Itching and Rash  . Nickel Dermatitis, Hives, Itching and Rash  . Nitrofurantoin Nausea And Vomiting and Rash    Social History   Socioeconomic History  . Marital status: Married    Spouse name: Not on file  . Number of children: Not on file  . Years of education: Not on file  . Highest education level: Not on file  Occupational History  . Not on  file  Social Needs  . Financial resource strain: Not on file  . Food insecurity:    Worry: Not on file    Inability: Not on file  . Transportation needs:    Medical: Not on file    Non-medical: Not on file  Tobacco Use  . Smoking status: Never Smoker  . Smokeless tobacco: Never Used  Substance and Sexual Activity  . Alcohol use: No    Comment: on occ before pregnant  . Drug use: No  . Sexual activity: Yes    Partners: Male  Lifestyle  . Physical activity:    Days per week: Not on file    Minutes per session: Not on file  . Stress: Not on file  Relationships  . Social connections:    Talks on phone: Not on file    Gets together: Not on file    Attends religious service: Not on file    Active member of club or organization: Not on file    Attends meetings of clubs or organizations: Not on file    Relationship status: Not on file  . Intimate partner violence:    Fear of current or ex partner: Not on file    Emotionally abused: Not on file    Physically abused: Not on file    Forced sexual activity: Not on file  Other Topics Concern  . Not on file  Social History Narrative  . Not on file    Family History  Problem Relation Age of Onset  . Hyperlipidemia Mother   . Hypertension Mother   . Migraines Mother   . Cancer Father        prostate  . Hypertension Father     The following portions of the patient's history were reviewed and updated as appropriate: allergies, current medications, past family history, past medical history, past social history, past surgical history and problem list.  Review of Systems  Review of Systems - Negative except as noted above. History obtained from the patient   Objective:   BP 136/77   Pulse 83   Wt 127 lb 8 oz (57.8 kg)   Breastfeeding? Yes   BMI 21.89 kg/m   CONSTITUTIONAL: Well-developed, well-nourished female in no acute distress.   [place]: Alert and oriented to person, place, and time.   PSYCHIATRIC: Normal  mood and affect. Normal behavior. Normal judgment and thought content.  PELVIC:  External Genitalia: Erythema noted  Vagina: Healing episiotomy, yellow discharge present   MUSCULOSKELETAL: Normal range of motion. No tenderness.  No cyanosis, clubbing, or edema.  Assessment:   1. Episiotomy pain - Anaerobic and Aerobic Culture  2. Vaginal itching - Anaerobic and Aerobic Culture   Plan:   Discussed home treatment measures for perineal tear.   Cultures collected will contact patient with results.   Rx: Diflucan, see orders.   Reviewed red flag symptoms and when to call.   RTC as needed.    Diona Fanti, CNMe [facility], [facility]

## 2017-07-08 LAB — ANAEROBIC AND AEROBIC CULTURE

## 2017-07-17 ENCOUNTER — Encounter: Payer: Self-pay | Admitting: Certified Nurse Midwife

## 2017-07-27 ENCOUNTER — Encounter: Payer: Self-pay | Admitting: Obstetrics and Gynecology

## 2017-07-27 ENCOUNTER — Ambulatory Visit (INDEPENDENT_AMBULATORY_CARE_PROVIDER_SITE_OTHER): Payer: BLUE CROSS/BLUE SHIELD | Admitting: Obstetrics and Gynecology

## 2017-07-27 NOTE — Progress Notes (Signed)
  Subjective:     Kim Conner is a 42 y.o. female who presents for a postpartum visit. She is 6 weeks postpartum following a spontaneous vaginal delivery. I have fully reviewed the prenatal and intrapartum course. The delivery was at 33 gestational weeks. Outcome: spontaneous vaginal delivery. Anesthesia: epidural. Postpartum course has been uncomplicated. Baby's course has been uncomplicated. Baby is feeding by breast. Bleeding no bleeding. Bowel function is normal. Bladder function is normal. Patient is not sexually active. Contraception method is abstinence. Postpartum depression screening: negative. PHQ-9 score is 2  Depression screen PHQ 2/9 07/27/2017  Decreased Interest 1  Down, Depressed, Hopeless 0  PHQ - 2 Score 1  Altered sleeping 0  Tired, decreased energy 1  Change in appetite 0  Feeling bad or failure about yourself  0  Trouble concentrating 0  Moving slowly or fidgety/restless 0  Suicidal thoughts 0  PHQ-9 Score 2  Difficult doing work/chores Not difficult at all    The following portions of the patient's history were reviewed and updated as appropriate: allergies, current medications, past family history, past medical history, past social history, past surgical history and problem list.  Review of Systems Pertinent items noted in HPI and remainder of comprehensive ROS otherwise negative.   Objective:    BP 133/84   Pulse 71   Ht 5\' 4"  (1.626 m)   Wt 127 lb 1.6 oz (57.7 kg)   Breastfeeding? Yes   BMI 21.82 kg/m   General:  alert, cooperative and appears stated age   Breasts:  inspection negative, no nipple discharge or bleeding, no masses or nodularity palpable and declined exam  Lungs: clear to auscultation bilaterally  Heart:  regular rate and rhythm, S1, S2 normal, no murmur, click, rub or gallop  Abdomen: soft, non-tender; bowel sounds normal; no masses,  no organomegaly   Vulva:  normal  Vagina: normal vagina, no discharge, exudate, lesion, or  erythema  Cervix:  multiparous appearance  Corpus: normal size, contour, position, consistency, mobility, non-tender  Adnexa:  no mass, fullness, tenderness  Rectal Exam: Not performed.        Assessment:     6 week postpartum exam. Pap smear not done at today's visit.   Plan:    1. Contraception: condoms 2. Labs obtained- will follow up accordingly 3. Follow up in: 3 months or as needed.

## 2017-07-27 NOTE — Patient Instructions (Signed)
  Place postpartum visit patient instructions here.  

## 2017-07-27 NOTE — Addendum Note (Signed)
Addended by: Dorette Grate, Letricia Krinsky L on: 07/27/2017 11:09 AM   Modules accepted: Orders

## 2017-07-28 LAB — CBC
HEMOGLOBIN: 13.1 g/dL (ref 11.1–15.9)
Hematocrit: 39.5 % (ref 34.0–46.6)
MCH: 29.7 pg (ref 26.6–33.0)
MCHC: 33.2 g/dL (ref 31.5–35.7)
MCV: 90 fL (ref 79–97)
Platelets: 347 10*3/uL (ref 150–450)
RBC: 4.41 x10E6/uL (ref 3.77–5.28)
RDW: 13 % (ref 12.3–15.4)
WBC: 6.9 10*3/uL (ref 3.4–10.8)

## 2017-07-28 LAB — FERRITIN: Ferritin: 40 ng/mL (ref 15–150)

## 2017-07-28 LAB — VITAMIN D 25 HYDROXY (VIT D DEFICIENCY, FRACTURES): Vit D, 25-Hydroxy: 42.7 ng/mL (ref 30.0–100.0)

## 2017-10-10 ENCOUNTER — Other Ambulatory Visit: Payer: Self-pay | Admitting: Obstetrics and Gynecology

## 2017-10-10 ENCOUNTER — Encounter: Payer: Self-pay | Admitting: Obstetrics and Gynecology

## 2017-11-16 ENCOUNTER — Encounter: Payer: Self-pay | Admitting: Obstetrics and Gynecology

## 2017-11-16 ENCOUNTER — Ambulatory Visit (INDEPENDENT_AMBULATORY_CARE_PROVIDER_SITE_OTHER): Payer: BLUE CROSS/BLUE SHIELD | Admitting: Obstetrics and Gynecology

## 2017-11-16 VITALS — BP 138/83 | HR 85 | Ht 64.0 in | Wt 123.1 lb

## 2017-11-16 DIAGNOSIS — Z01419 Encounter for gynecological examination (general) (routine) without abnormal findings: Secondary | ICD-10-CM

## 2017-11-16 DIAGNOSIS — E049 Nontoxic goiter, unspecified: Secondary | ICD-10-CM

## 2017-11-16 NOTE — Patient Instructions (Signed)
Preventive Care 18-39 Years, Female Preventive care refers to lifestyle choices and visits with your health care provider that can promote health and wellness. What does preventive care include?  A yearly physical exam. This is also called an annual well check.  Dental exams once or twice a year.  Routine eye exams. Ask your health care provider how often you should have your eyes checked.  Personal lifestyle choices, including: ? Daily care of your teeth and gums. ? Regular physical activity. ? Eating a healthy diet. ? Avoiding tobacco and drug use. ? Limiting alcohol use. ? Practicing safe sex. ? Taking vitamin and mineral supplements as recommended by your health care provider. What happens during an annual well check? The services and screenings done by your health care provider during your annual well check will depend on your age, overall health, lifestyle risk factors, and family history of disease. Counseling Your health care provider may ask you questions about your:  Alcohol use.  Tobacco use.  Drug use.  Emotional well-being.  Home and relationship well-being.  Sexual activity.  Eating habits.  Work and work Statistician.  Method of birth control.  Menstrual cycle.  Pregnancy history.  Screening You may have the following tests or measurements:  Height, weight, and BMI.  Diabetes screening. This is done by checking your blood sugar (glucose) after you have not eaten for a while (fasting).  Blood pressure.  Lipid and cholesterol levels. These may be checked every 5 years starting at age 38.  Skin check.  Hepatitis C blood test.  Hepatitis B blood test.  Sexually transmitted disease (STD) testing.  BRCA-related cancer screening. This may be done if you have a family history of breast, ovarian, tubal, or peritoneal cancers.  Pelvic exam and Pap test. This may be done every 3 years starting at age 38. Starting at age 30, this may be done  every 5 years if you have a Pap test in combination with an HPV test.  Discuss your test results, treatment options, and if necessary, the need for more tests with your health care provider. Vaccines Your health care provider may recommend certain vaccines, such as:  Influenza vaccine. This is recommended every year.  Tetanus, diphtheria, and acellular pertussis (Tdap, Td) vaccine. You may need a Td booster every 10 years.  Varicella vaccine. You may need this if you have not been vaccinated.  HPV vaccine. If you are 39 or younger, you may need three doses over 6 months.  Measles, mumps, and rubella (MMR) vaccine. You may need at least one dose of MMR. You may also need a second dose.  Pneumococcal 13-valent conjugate (PCV13) vaccine. You may need this if you have certain conditions and were not previously vaccinated.  Pneumococcal polysaccharide (PPSV23) vaccine. You may need one or two doses if you smoke cigarettes or if you have certain conditions.  Meningococcal vaccine. One dose is recommended if you are age 68-21 years and a first-year college student living in a residence hall, or if you have one of several medical conditions. You may also need additional booster doses.  Hepatitis A vaccine. You may need this if you have certain conditions or if you travel or work in places where you may be exposed to hepatitis A.  Hepatitis B vaccine. You may need this if you have certain conditions or if you travel or work in places where you may be exposed to hepatitis B.  Haemophilus influenzae type b (Hib) vaccine. You may need this  if you have certain risk factors.  Talk to your health care provider about which screenings and vaccines you need and how often you need them. This information is not intended to replace advice given to you by your health care provider. Make sure you discuss any questions you have with your health care provider. Document Released: 04/20/2001 Document Revised:  11/12/2015 Document Reviewed: 12/24/2014 Elsevier Interactive Patient Education  2018 Elsevier Inc.  

## 2017-11-16 NOTE — Progress Notes (Signed)
Subjective:   Kim Conner is a 42 y.o. G81P1001 Caucasian female here for a routine well-woman exam.  No LMP recorded.    Current complaints: hair loss and breast gland feels like a lump in the mornings.  PCP: none       doesn't desire labs  Social History: Sexual: heterosexual Marital Status: married Living situation: with family Occupation: homemaker Tobacco/alcohol: no tobacco use Illicit drugs: no history of illicit drug use  The following portions of the patient's history were reviewed and updated as appropriate: allergies, current medications, past family history, past medical history, past social history, past surgical history and problem list.  Past Medical History Past Medical History:  Diagnosis Date  . Gall stones   . Iodine goiter   . Left ovarian cyst     Past Surgical History Past Surgical History:  Procedure Laterality Date  . none      Gynecologic History G1P1001  No LMP recorded. Contraception: condoms Last Pap: 2018. Results were: normal   Obstetric History OB History  Gravida Para Term Preterm AB Living  1 1 1     1   SAB TAB Ectopic Multiple Live Births        0 1    # Outcome Date GA Lbr Len/2nd Weight Sex Delivery Anes PTL Lv  1 Term 06/15/17 [redacted]w[redacted]d / 09:30 7 lb 2.6 oz (3.25 kg) M Vag-Spont EPI, Local  LIV    Current Medications Current Outpatient Medications on File Prior to Visit  Medication Sig Dispense Refill  . Cholecalciferol (VITAMIN D3) 5000 units CAPS Take 1 capsule (5,000 Units total) by mouth daily. 120 capsule 2  . fluconazole (DIFLUCAN) 150 MG tablet Take 1 tablet (150 mg total) by mouth every 3 (three) days. For three doses (Patient not taking: Reported on 07/27/2017) 3 tablet 3  . Iron-FA-B Cmp-C-Biot-Probiotic (FUSION PLUS) CAPS TAKE 1 CAPSULE BY MOUTH EVERY DAY (Patient not taking: Reported on 11/16/2017) 60 capsule 1   No current facility-administered medications on file prior to visit.     Review of  Systems Patient denies any headaches, blurred vision, shortness of breath, chest pain, abdominal pain, problems with bowel movements, urination, or intercourse.  Objective:  BP 138/83   Pulse 85   Ht 5\' 4"  (1.626 m)   Wt 123 lb 1.6 oz (55.8 kg)   Breastfeeding? Yes   BMI 21.13 kg/m  Physical Exam  General:  Well developed, well nourished, no acute distress. She is alert and oriented x3. Skin:  Warm and dry Neck:  Midline trachea, no thyromegaly or nodules Cardiovascular: Regular rate and rhythm, no murmur heard Lungs:  Effort normal, all lung fields clear to auscultation bilaterally Breasts:  No dominant palpable mass, retraction, or nipple discharge Abdomen:  Soft, non tender, no hepatosplenomegaly or masses Pelvic:  External genitalia is normal in appearance.  The vagina is normal in appearance. The cervix is bulbous, no CMT.  Thin prep pap is not done . Uterus is felt to be normal size, shape, and contour.  No adnexal masses or tenderness noted. Extremities:  No swelling or varicosities noted Psych:  She has a normal mood and affect  Assessment:   Healthy well-woman exam Thyroid disorder Lactational amenorrhea  Plan:  Labs obtained- will follow up accordingly, also sent in referral to establish care with Endocrinology F/U 1 year for AE, or sooner if needed Mammogram deferred until not nursing.    Keyerra Lamere Rockney Ghee, CNM

## 2017-11-17 LAB — COMPREHENSIVE METABOLIC PANEL
ALT: 13 IU/L (ref 0–32)
AST: 14 IU/L (ref 0–40)
Albumin/Globulin Ratio: 1.7 (ref 1.2–2.2)
Albumin: 4.7 g/dL (ref 3.5–5.5)
Alkaline Phosphatase: 106 IU/L (ref 39–117)
BILIRUBIN TOTAL: 0.7 mg/dL (ref 0.0–1.2)
BUN/Creatinine Ratio: 22 (ref 9–23)
BUN: 14 mg/dL (ref 6–24)
CO2: 24 mmol/L (ref 20–29)
CREATININE: 0.64 mg/dL (ref 0.57–1.00)
Calcium: 10.1 mg/dL (ref 8.7–10.2)
Chloride: 99 mmol/L (ref 96–106)
GFR calc Af Amer: 129 mL/min/{1.73_m2} (ref >59)
GFR calc non Af Amer: 112 mL/min/{1.73_m2} (ref >59)
GLUCOSE: 79 mg/dL (ref 65–99)
Globulin, Total: 2.7 g/dL (ref 1.5–4.5)
Potassium: 4.6 mmol/L (ref 3.5–5.2)
Sodium: 139 mmol/L (ref 134–144)
Total Protein: 7.4 g/dL (ref 6.0–8.5)

## 2017-11-17 LAB — THYROID PANEL WITH TSH
FREE THYROXINE INDEX: 2.1 (ref 1.2–4.9)
T3 Uptake Ratio: 27 % (ref 24–39)
T4 TOTAL: 7.8 ug/dL (ref 4.5–12.0)
TSH: 1.64 u[IU]/mL (ref 0.450–4.500)

## 2017-11-24 ENCOUNTER — Ambulatory Visit: Payer: Self-pay | Admitting: Family Medicine

## 2017-12-22 ENCOUNTER — Encounter: Payer: Self-pay | Admitting: Internal Medicine

## 2017-12-22 ENCOUNTER — Ambulatory Visit (INDEPENDENT_AMBULATORY_CARE_PROVIDER_SITE_OTHER): Payer: BLUE CROSS/BLUE SHIELD | Admitting: Internal Medicine

## 2017-12-22 VITALS — BP 122/74 | HR 80 | Ht 64.0 in | Wt 123.6 lb

## 2017-12-22 DIAGNOSIS — E049 Nontoxic goiter, unspecified: Secondary | ICD-10-CM | POA: Diagnosis not present

## 2017-12-22 DIAGNOSIS — Z923 Personal history of irradiation: Secondary | ICD-10-CM

## 2017-12-22 NOTE — Progress Notes (Signed)
Name: Kim Conner  MRN/ DOB: [id number], [birth year]    Age/ Sex: 42 y.o., female    PCP: Patient, No Pcp Per   Reason for Endocrinology Evaluation: Thyroid Goiter     Date of Initial Endocrinology Evaluation: 12/22/2017     HPI: [name] is a 42 y.o. female with a past medical history of Hyperthyroidism. The patient presented for initial endocrinology clinic visit on 12/22/2017 for consultative assistance with her Thyroid Goiter  Patient moved from [place] a year ago. At age 59 she was diagnosed with hyperthyroidism and goiter. She has required a couple treatment of RAI ablation.   Patient states she was on LT-4 replacement prior to her RAI ablation ? Marland Kitchen Following the RAI ablation she did not require LT-4 replacement but has been advised by her endocrinologist in [place] to take Iodine which she has been doing for years.   She has a thyroid enlargement since her teenage years. This has been stable. No pain, voice hoarseness, dysphagia or SOB.   Over the years she has had serial thyroid ultrasound but no biopsy have been performed.  She denies any hypothyroid or hyperthyroid symptoms.   Her mother and sister have goiter but no FH of thyroid disease or other endocrine disorder.   She has a 52 month old boy.   She did bring ultrasound picture of her thyroid as well as a copy of ultrasound report.   HISTORY:  Past Medical History:  Past Medical History:  Diagnosis Date  . Gall stones   . Iodine goiter   . Left ovarian cyst     Past Surgical History:  Past Surgical History:  Procedure Laterality Date  . none        Social History:  reports that she has never smoked. She has never used smokeless tobacco. She reports that she does not drink alcohol or use drugs.  Family History: family history includes Cancer in her father; Hyperlipidemia in her mother; Hypertension in her father and mother; Migraines in her mother.   HOME MEDICATIONS: Current  Outpatient Medications on File Prior to Visit  Medication Sig Dispense Refill  . Cholecalciferol (VITAMIN D3) 5000 units CAPS Take 1 capsule (5,000 Units total) by mouth daily. 120 capsule 2  . fluconazole (DIFLUCAN) 150 MG tablet Take 1 tablet (150 mg total) by mouth every 3 (three) days. For three doses (Patient not taking: Reported on 07/27/2017) 3 tablet 3  . Iron-FA-B Cmp-C-Biot-Probiotic (FUSION PLUS) CAPS TAKE 1 CAPSULE BY MOUTH EVERY DAY (Patient not taking: Reported on 11/16/2017) 60 capsule 1   No current facility-administered medications on file prior to visit.       REVIEW OF SYSTEMS: A comprehensive ROS was conducted with the patient and is negative except as per HPI and below:  ROS     OBJECTIVE:  VS: BP 122/74 (BP Location: Left Arm, Patient Position: Sitting, Cuff Size: Normal)   Pulse 80   Ht 5\' 4"  (1.626 m)   Wt 56.1 kg   SpO2 97%   BMI 21.22 kg/m    EXAM: General: Pt appears well and is in NAD  Hydration: Well-hydrated with moist mucous membranes and good skin turgor  Eyes: External eye exam normal without stare, lid lag or exophthalmos.  EOM intact.  PERRL.  Ears, Nose, Throat: Hearing: Grossly intact bilaterally Dental: Poor dentition  Throat: Clear without mass, erythema or exudate  Neck: General: Supple without adenopathy. Thyroid: Enlaraged ~ 50 grams .  No nodules appreciated. No thyroid bruit.  Lungs: Clear with good BS bilat with no rales, rhonchi, or wheezes  Heart: Auscultation: RRR.  Abdomen: Normoactive bowel sounds, soft, nontender, without masses or organomegaly palpable  Extremities: BL LE: No pretibial edema normal ROM and strength.  Skin: Hair: Texture and amount normal with gender appropriate distribution Skin Inspection: No rashes.  Skin Palpation: Skin temperature, texture, and thickness normal to palpation  Neuro: Cranial nerves: II - XII grossly intact  Motor: Normal strength throughout DTRs: 2+ and symmetric in UE without delay in  relaxation phase  Mental Status: Judgment, insight: Intact Orientation: Oriented to time, place, and person Mood and affect: No depression, anxiety, or agitation     DATA REVIEWED:  Results for Kim Conner, Kim Conner (MRN [id number]) as of 12/23/2017 16:38  Ref. Range 11/16/2017 10:34  TSH Latest Ref Range: 0.450 - 4.500 uIU/mL 1.640    Thyroid Ultrasound (2017):  Heterogenous gland. Right inferior thyroid lobe there's a solid cystic nodule with comet tail artifact and peripheral vascularization that measures 0.86x 0.68 x 1.3 cm . There's another hypoechoic cyst with a max diameter of 0.43 cm.  Left thyroid lobe there's hypoechoic avascular nodule that measures 0.23 x 0.3 cm.     ASSESSMENT/PLAN/RECOMMENDATIONS:   1. Hx of hyperthyroidism S/P RAI x 2 as a Teenager:  - Clinically and bio-chemically euthyroid - Denies any local neck symptoms.  - She was advised to stop the Iodine at this time - Repeat TFT on next visit  - Patient to contact us with any change in symptoms.    2. Thyroid Goiter: - She denies any local neck symptoms  - Stable in size since her teenage years - I have discussed with her that with 2 RAI ablation treatments there's a high risk for false positive results on cytology reports. My recommendation at this time is not to biopsy any nodules unless there are clear concerning features on ultrasound such as lymphadenopathy.  - Will obtain an ultrasound as a baseline  - Patient to notify us with any local neck symptoms such as sudden increase in size, pain, voice hoarseness, sob.   Signed electronically by:  Mack Guise, MD  [facility]  [facility] [address] [place], [place] [address] Phone: [phone] FAX: [phone]   CC: Patient, No Pcp Per No address on file Phone: None Fax: None   Return to Endocrinology clinic as below: Future Appointments  Date Time Provider [place]   01/18/2018  9:00 AM Virginia Crews, MD [facility] None  11/21/2018  2:00 PM Joylene Igo, CNM [facility] None

## 2017-12-22 NOTE — Patient Instructions (Signed)
-   Please stop the Iodine replacement at this time - We will schedule you are [facility] in [place] for thyroid Ultrasound

## 2018-01-18 ENCOUNTER — Ambulatory Visit (INDEPENDENT_AMBULATORY_CARE_PROVIDER_SITE_OTHER): Payer: BLUE CROSS/BLUE SHIELD | Admitting: Family Medicine

## 2018-01-18 ENCOUNTER — Encounter: Payer: Self-pay | Admitting: Family Medicine

## 2018-01-18 VITALS — BP 133/84 | HR 74 | Temp 98.3°F | Ht 64.0 in | Wt 126.8 lb

## 2018-01-18 DIAGNOSIS — G8929 Other chronic pain: Secondary | ICD-10-CM

## 2018-01-18 DIAGNOSIS — M545 Low back pain: Secondary | ICD-10-CM | POA: Diagnosis not present

## 2018-01-18 DIAGNOSIS — M654 Radial styloid tenosynovitis [de Quervain]: Secondary | ICD-10-CM | POA: Diagnosis not present

## 2018-01-18 DIAGNOSIS — Z13220 Encounter for screening for lipoid disorders: Secondary | ICD-10-CM | POA: Diagnosis not present

## 2018-01-18 DIAGNOSIS — Z23 Encounter for immunization: Secondary | ICD-10-CM | POA: Diagnosis not present

## 2018-01-18 DIAGNOSIS — E049 Nontoxic goiter, unspecified: Secondary | ICD-10-CM | POA: Diagnosis not present

## 2018-01-18 DIAGNOSIS — K8020 Calculus of gallbladder without cholecystitis without obstruction: Secondary | ICD-10-CM

## 2018-01-18 DIAGNOSIS — Z8639 Personal history of other endocrine, nutritional and metabolic disease: Secondary | ICD-10-CM

## 2018-01-18 NOTE — Progress Notes (Signed)
Patient: Kim Conner, Female    DOB: 1976-01-12, 42 y.o.   MRN: [id number] Visit Date: 01/18/2018  Today's Provider: Lavon Paganini, MD   Chief Complaint  Patient presents with  . Establish Care   Subjective:  I, Kim Conner, CMA, am acting as a scribe for Lavon Paganini, MD.    New Patient:  Kim Conner is a 42 y.o. female who presents today to [place].  She feels well. She reports exercising none. She reports she is sleeping well. Patient states she moved here from [place] last year. Her husband has family in the area.  She is 7 months postpartum and has not resumed menses yet.  She is breastfeeding,  She is followed by [facility].  She is sexually active with husband and using condoms, no hormonal contraception.  She was diagnosed with hyperthyroidism and goiter at age 33.  She had 2 RAI ablation treatments.  Following RAI ablation, she didn't require Synthroid, but was advised to take Iodine.  She denies hyper/hypothyroid symptoms.  She is now being followed by [facility].  Patient also reports known gallstones that have been present for a few years.  She denies any symptoms or right upper quadrant pain.  She is able to eat her regular diet without symptoms.  Patient is also concerned about lower back pain.  This is been present intermittently for many years.  She is never had any imaging or taking any meds.  She was concerned a few months ago when she was unable to pick up her son due to pain.  The pain does not radiate.  She is not sure what makes it worse, but it is stiff in the mornings.  Heating pad helps with the pain.  Patient is also developed bilateral wrist pain at the base of her thumb that is been present since her son was born.  It is worse with lifting things.  She has not tried any medications.  She is not sure what makes it better.  Patient also has a history of hypertension.  She was previously on an  antihypertensive in [place], but then she was seen by cardiologist and had ambulatory blood pressure monitoring for 24 hours.  Her blood pressure only spiked over 140/90 once and so she was told this was normal and she did not need any medications.  -----------------------------------------------------------------   Review of Systems  Constitutional: Negative.   HENT: Positive for sneezing.   Eyes: Negative.   Respiratory: Negative.   Cardiovascular: Positive for palpitations.  Gastrointestinal: Negative.   Endocrine: Negative.   Genitourinary: Negative.   Musculoskeletal: Positive for arthralgias and back pain.  Skin: Negative.   Allergic/Immunologic: Positive for environmental allergies.  Neurological: Negative.   Hematological: Negative.   Psychiatric/Behavioral: Negative.     Social History      She  reports that she has never smoked. She has never used smokeless tobacco. She reports that she does not drink alcohol or use drugs.       Social History   Socioeconomic History  . Marital status: Married    Spouse name: Not on file  . Number of children: Not on file  . Years of education: Not on file  . Highest education level: Not on file  Occupational History  . Not on file  Social Needs  . Financial resource strain: Not on file  . Food insecurity:    Worry: Not on file    Inability: Not on file  .  Transportation needs:    Medical: Not on file    Non-medical: Not on file  Tobacco Use  . Smoking status: Never Smoker  . Smokeless tobacco: Never Used  Substance and Sexual Activity  . Alcohol use: No    Comment: on occ before pregnant  . Drug use: No  . Sexual activity: Yes    Partners: Male    Birth control/protection: Condom  Lifestyle  . Physical activity:    Days per week: Not on file    Minutes per session: Not on file  . Stress: Not on file  Relationships  . Social connections:    Talks on phone: Not on file    Gets together: Not on file    Attends  religious service: Not on file    Active member of club or organization: Not on file    Attends meetings of clubs or organizations: Not on file    Relationship status: Not on file  Other Topics Concern  . Not on file  Social History Narrative  . Not on file    Past Medical History:  Diagnosis Date  . Allergy   . Gall stones   . Hypertension   . Iodine goiter   . Left ovarian cyst      Patient Active Problem List   Diagnosis Date Noted  . SPROM (prolonged spontaneous rupture of membranes) 06/14/2017  . Goiter 11/23/2016  . Gallstone 11/23/2016  . Atypical squamous cell changes of undetermined significance (ASCUS) on vaginal cytology 11/23/2016    Past Surgical History:  Procedure Laterality Date  . none      Family History        Family Status  Relation Name Status  . Mother  (Not Specified)  . Father  (Not Specified)  . Mat Aunt  (Not Specified)  . Mat Uncle  (Not Specified)  . Annamarie Major  (Not Specified)        Her family history includes Breast cancer in her maternal aunt; Cancer in her father; Cirrhosis in her maternal uncle; Clotting disorder in her father; Depression in her mother; Gallstones in her father and mother; Goiter in her father and mother; Hyperlipidemia in her mother; Hypertension in her father and mother; Migraines in her mother; Pancreatic cancer in her paternal uncle; Stomach cancer in her maternal aunt.      Allergies  Allergen Reactions  . Chromium Dermatitis, Hives, Itching and Rash  . Cobalt Dermatitis, Hives, Itching and Rash  . Nickel Dermatitis, Hives, Itching and Rash  . Nitrofurantoin Nausea And Vomiting and Rash     Current Outpatient Medications:  .  Cholecalciferol (VITAMIN D3) 5000 units CAPS, Take 1 capsule (5,000 Units total) by mouth daily., Disp: 120 capsule, Rfl: 2   Patient Care Team: Virginia Crews, MD as PCP - General (Family Medicine)      Objective:   Vitals: BP 133/84 (BP Location: Left Arm, Patient  Position: Sitting, Cuff Size: Normal)   Pulse 74   Temp 98.3 F (36.8 C) (Oral)   Ht 5\' 4"  (1.626 m)   Wt 126 lb 12.8 oz (57.5 kg)   SpO2 99%   BMI 21.77 kg/m    Vitals:   01/18/18 0847  BP: 133/84  Pulse: 74  Temp: 98.3 F (36.8 C)  TempSrc: Oral  SpO2: 99%  Weight: 126 lb 12.8 oz (57.5 kg)  Height: 5\' 4"  (1.626 m)     Physical Exam  Constitutional: She is oriented to person, place, and time.  She appears well-developed and well-nourished. No distress.  HENT:  Head: Normocephalic and atraumatic.  Right Ear: External ear normal.  Left Ear: External ear normal.  Nose: Nose normal.  Mouth/Throat: Oropharynx is clear and moist.  Eyes: Pupils are equal, round, and reactive to light. Conjunctivae and EOM are normal. No scleral icterus.  Neck: Neck supple. No thyromegaly present.  Cardiovascular: Normal rate, regular rhythm, normal heart sounds and intact distal pulses.  No murmur heard. Pulmonary/Chest: Effort normal and breath sounds normal. No respiratory distress. She has no wheezes. She has no rales.  Abdominal: Soft. Bowel sounds are normal. She exhibits no distension. There is no tenderness. There is no rebound and no guarding.  Musculoskeletal: She exhibits no edema or deformity.  Back: Normal range of motion.  No midline tenderness to palpation over spinous processes or SI joints.  No tenderness palpation of the lower paraspinal musculature.  Negative straight leg raise bilaterally.  Strength and sensation intact in bilateral lower extremities. Bilateral wrists: Range of motion is intact.  No tenderness to palpation.  Positive Finkelstein's bilaterally  Lymphadenopathy:    She has no cervical adenopathy.  Neurological: She is alert and oriented to person, place, and time.  Skin: Skin is warm and dry. Capillary refill takes less than 2 seconds. No rash noted.  Psychiatric: She has a normal mood and affect. Her behavior is normal.  Vitals reviewed.    Depression  Screen PHQ 2/9 Scores 01/18/2018 07/27/2017  PHQ - 2 Score 0 1  PHQ- 9 Score 3 2    Reviewed labs from earlier this year, including CMP, CBC, and TFTs  Assessment & Plan:     Establish care  Exercise Activities and Dietary recommendations Goals   None     Immunization History  Administered Date(s) Administered  . Influenza,inj,Quad PF,6+ Mos 01/12/2017  . Tdap 04/07/2017    Health Maintenance  Topic Date Due  . INFLUENZA VACCINE  10/06/2017  . PAP SMEAR  12/15/2019  . TETANUS/TDAP  04/08/2027  . HIV Screening  Completed     Discussed health benefits of physical activity, and encouraged her to engage in regular exercise appropriate for her age and condition.    --------------------------------------------------------------------  Problem List Items Addressed This Visit      Digestive   Gallstones    Given that she is asymptomatic, no intervention necessary at this time Discussed that gallbladder will contract less with low fat diet        Endocrine   Thyroid goiter    Followed by Endocrinology Advised to call and check with them as [place] has not yet been scheduled        Musculoskeletal and Integument   De Quervain's tenosynovitis, bilateral    Exam consistent with de Quervain's tenosynovitis Likely from repetitive motion of lifting her infant Discussed wrist braces Could use NSAIDs, but risks decreased milk supply as she is currently breast-feeding If worsens significantly, could consider hand surgery referral for possible corticosteroid injection Given HEP        Other   Chronic bilateral low back pain without sciatica - Primary    Benign exam Discussed this is likely muscular pain and stiffness Discussed stretching, core strengthening, heat, anti-inflammatories She will not use anti-inflammatories currently as this could decrease her milk supply as she is breast-feeding Given home exercise program Could consider physical therapy in the future        Other Visit Diagnoses    Screening for lipid disorders  Relevant Orders   Lipid panel   History of hyperthyroidism       Need for influenza vaccination       Relevant Orders   Flu Vaccine QUAD 6+ mos PF IM (Fluarix Quad PF) (Completed)       Return in about 1 year (around 01/19/2019) for CPE.   The entirety of the information documented in the History of Present Illness, Review of Systems and Physical Exam were personally obtained by me. Portions of this information were initially documented by Kim Conner, CMA and reviewed by me for thoroughness and accuracy.    Virginia Crews, MD, MPH [facility] 01/18/2018 11:06 AM

## 2018-01-18 NOTE — Patient Instructions (Signed)
Preventive Care 18-39 Years, Female Preventive care refers to lifestyle choices and visits with your health care provider that can promote health and wellness. What does preventive care include?  A yearly physical exam. This is also called an annual well check.  Dental exams once or twice a year.  Routine eye exams. Ask your health care provider how often you should have your eyes checked.  Personal lifestyle choices, including: ? Daily care of your teeth and gums. ? Regular physical activity. ? Eating a healthy diet. ? Avoiding tobacco and drug use. ? Limiting alcohol use. ? Practicing safe sex. ? Taking vitamin and mineral supplements as recommended by your health care provider. What happens during an annual well check? The services and screenings done by your health care provider during your annual well check will depend on your age, overall health, lifestyle risk factors, and family history of disease. Counseling Your health care provider may ask you questions about your:  Alcohol use.  Tobacco use.  Drug use.  Emotional well-being.  Home and relationship well-being.  Sexual activity.  Eating habits.  Work and work Statistician.  Method of birth control.  Menstrual cycle.  Pregnancy history.  Screening You may have the following tests or measurements:  Height, weight, and BMI.  Diabetes screening. This is done by checking your blood sugar (glucose) after you have not eaten for a while (fasting).  Blood pressure.  Lipid and cholesterol levels. These may be checked every 5 years starting at age 66.  Skin check.  Hepatitis C blood test.  Hepatitis B blood test.  Sexually transmitted disease (STD) testing.  BRCA-related cancer screening. This may be done if you have a family history of breast, ovarian, tubal, or peritoneal cancers.  Pelvic exam and Pap test. This may be done every 3 years starting at age 40. Starting at age 59, this may be done every 5  years if you have a Pap test in combination with an HPV test.  Discuss your test results, treatment options, and if necessary, the need for more tests with your health care provider. Vaccines Your health care provider may recommend certain vaccines, such as:  Influenza vaccine. This is recommended every year.  Tetanus, diphtheria, and acellular pertussis (Tdap, Td) vaccine. You may need a Td booster every 10 years.  Varicella vaccine. You may need this if you have not been vaccinated.  HPV vaccine. If you are 69 or younger, you may need three doses over 6 months.  Measles, mumps, and rubella (MMR) vaccine. You may need at least one dose of MMR. You may also need a second dose.  Pneumococcal 13-valent conjugate (PCV13) vaccine. You may need this if you have certain conditions and were not previously vaccinated.  Pneumococcal polysaccharide (PPSV23) vaccine. You may need one or two doses if you smoke cigarettes or if you have certain conditions.  Meningococcal vaccine. One dose is recommended if you are age 27-21 years and a first-year college student living in a residence hall, or if you have one of several medical conditions. You may also need additional booster doses.  Hepatitis A vaccine. You may need this if you have certain conditions or if you travel or work in places where you may be exposed to hepatitis A.  Hepatitis B vaccine. You may need this if you have certain conditions or if you travel or work in places where you may be exposed to hepatitis B.  Haemophilus influenzae type b (Hib) vaccine. You may need this if  you have certain risk factors.  Talk to your health care provider about which screenings and vaccines you need and how often you need them. This information is not intended to replace advice given to you by your health care provider. Make sure you discuss any questions you have with your health care provider. Document Released: 04/20/2001 Document Revised: 11/12/2015  Document Reviewed: 12/24/2014 Elsevier Interactive Patient Education  [employer].

## 2018-01-18 NOTE — Assessment & Plan Note (Signed)
Followed by Endocrinology Advised to call and check with them as [place] has not yet been scheduled

## 2018-01-18 NOTE — Assessment & Plan Note (Signed)
Exam consistent with de Quervain's tenosynovitis Likely from repetitive motion of lifting her infant Discussed wrist braces Could use NSAIDs, but risks decreased milk supply as she is currently breast-feeding If worsens significantly, could consider hand surgery referral for possible corticosteroid injection Given HEP

## 2018-01-18 NOTE — Assessment & Plan Note (Signed)
Given that she is asymptomatic, no intervention necessary at this time Discussed that gallbladder will contract less with low fat diet

## 2018-01-18 NOTE — Assessment & Plan Note (Signed)
Benign exam Discussed this is likely muscular pain and stiffness Discussed stretching, core strengthening, heat, anti-inflammatories She will not use anti-inflammatories currently as this could decrease her milk supply as she is breast-feeding Given home exercise program Could consider physical therapy in the future

## 2018-01-19 ENCOUNTER — Telehealth: Payer: Self-pay | Admitting: Internal Medicine

## 2018-01-19 ENCOUNTER — Telehealth: Payer: Self-pay

## 2018-01-19 LAB — LIPID PANEL
Chol/HDL Ratio: 2.9 ratio (ref 0.0–4.4)
Cholesterol, Total: 204 mg/dL — ABNORMAL HIGH (ref 100–199)
HDL: 70 mg/dL (ref >39)
LDL CALC: 107 mg/dL — AB (ref 0–99)
Triglycerides: 133 mg/dL (ref 0–149)
VLDL CHOLESTEROL CAL: 27 mg/dL (ref 5–40)

## 2018-01-19 NOTE — Telephone Encounter (Signed)
Attempted to contact patient. No answer or voicemail.

## 2018-01-19 NOTE — Telephone Encounter (Signed)
Patient has not heard from anyone re: ultrasound appointment being scheduled. Please call patient at ph# [phone] to advise

## 2018-01-19 NOTE — Telephone Encounter (Signed)
Attempted to contact patient. No answer and unable to leave a message. Voicemail is not set up.

## 2018-01-19 NOTE — Telephone Encounter (Signed)
Pt returned missed call for lab results.  Please call pt back.  Thanks, [employer]

## 2018-01-19 NOTE — Telephone Encounter (Signed)
-----   Message from Virginia Crews, MD sent at 01/19/2018  8:33 AM EST ----- Normal cholesterol.  Virginia Crews, MD, MPH [facility] 01/19/2018 8:33 AM

## 2018-01-23 NOTE — Telephone Encounter (Signed)
Patient was advised of results. Expressed understanding.  

## 2018-01-23 NOTE — Telephone Encounter (Signed)
appt scheduled for 01/25/18 @3 :30  Pt aware of appt date and time

## 2018-01-25 ENCOUNTER — Ambulatory Visit
Admission: RE | Admit: 2018-01-25 | Discharge: 2018-01-25 | Disposition: A | Discharge status: Home / Self Care | Payer: BLUE CROSS/BLUE SHIELD | Source: Ambulatory Visit | Attending: Internal Medicine | Admitting: Internal Medicine

## 2018-01-25 DIAGNOSIS — E049 Nontoxic goiter, unspecified: Secondary | ICD-10-CM | POA: Diagnosis present

## 2018-01-25 DIAGNOSIS — E010 Iodine-deficiency related diffuse (endemic) goiter: Secondary | ICD-10-CM | POA: Insufficient documentation

## 2018-02-22 ENCOUNTER — Encounter: Payer: Self-pay | Admitting: Family Medicine

## 2018-02-22 ENCOUNTER — Ambulatory Visit (INDEPENDENT_AMBULATORY_CARE_PROVIDER_SITE_OTHER): Payer: BLUE CROSS/BLUE SHIELD | Admitting: Family Medicine

## 2018-02-22 VITALS — BP 130/89 | HR 85 | Temp 98.3°F | Wt 127.8 lb

## 2018-02-22 DIAGNOSIS — M5441 Lumbago with sciatica, right side: Secondary | ICD-10-CM | POA: Diagnosis not present

## 2018-02-22 DIAGNOSIS — G8929 Other chronic pain: Secondary | ICD-10-CM | POA: Diagnosis not present

## 2018-02-22 MED ORDER — PREDNISONE 20 MG PO TABS: 40.0000 mg | ORAL_TABLET | Freq: Every day | ORAL | 0 refills | Status: AC

## 2018-02-22 NOTE — Patient Instructions (Signed)
Sciatica    Sciatica is pain, numbness, weakness, or tingling along the path of the sciatic nerve. The sciatic nerve starts in the lower back and runs down the back of each leg. The nerve controls the muscles in the lower leg and in the back of the knee. It also provides feeling (sensation) to the back of the thigh, the lower leg, and the sole of the foot. Sciatica is a symptom of another medical condition that pinches or puts pressure on the sciatic nerve.  Generally, sciatica only affects one side of the body. Sciatica usually goes away on its own or with treatment. In some cases, sciatica may keep coming back (recur).  What are the causes?  This condition is caused by pressure on the sciatic nerve, or pinching of the sciatic nerve. This may be the result of:  · A disk in between the bones of the spine (vertebrae) bulging out too far (herniated disk).  · Age-related changes in the spinal disks (degenerative disk disease).  · A pain disorder that affects a muscle in the buttock (piriformis syndrome).  · Extra bone growth (bone spur) near the sciatic nerve.  · An injury or break (fracture) of the pelvis.  · Pregnancy.  · Tumor (rare).  What increases the risk?  The following factors may make you more likely to develop this condition:  · Playing sports that place pressure or stress on the spine, such as football or weight lifting.  · Having poor strength and flexibility.  · A history of back injury.  · A history of back surgery.  · Sitting for long periods of time.  · Doing activities that involve repetitive bending or lifting.  · Obesity.  What are the signs or symptoms?  Symptoms can vary from mild to very severe, and they may include:  · Any of these problems in the lower back, leg, hip, or buttock:  ? Mild tingling or dull aches.  ? Burning sensations.  ? Sharp pains.  · Numbness in the back of the calf or the sole of the foot.  · Leg weakness.  · Severe back pain that makes movement difficult.  These symptoms  may get worse when you cough, sneeze, or laugh, or when you sit or stand for long periods of time. Being overweight may also make symptoms worse. In some cases, symptoms may recur over time.  How is this diagnosed?  This condition may be diagnosed based on:  · Your symptoms.  · A physical exam. Your health care provider may ask you to do certain movements to check whether those movements trigger your symptoms.  · You may have tests, including:  ? Blood tests.  ? X-rays.  ? MRI.  ? CT scan.  How is this treated?  In many cases, this condition improves on its own, without any treatment. However, treatment may include:  · Reducing or modifying physical activity during periods of pain.  · Exercising and stretching to strengthen your abdomen and improve the flexibility of your spine.  · Icing and applying heat to the affected area.  · Medicines that help:  ? To relieve pain and swelling.  ? To relax your muscles.  · Injections of medicines that help to relieve pain, irritation, and inflammation around the sciatic nerve (steroids).  · Surgery.  Follow these instructions at home:  Medicines  · Take over-the-counter and prescription medicines only as told by your health care provider.  · Do not drive or operate heavy   machinery while taking prescription pain medicine.  Managing pain  · If directed, apply ice to the affected area.  ? Put ice in a plastic bag.  ? Place a towel between your skin and the bag.  ? Leave the ice on for 20 minutes, 2-3 times a day.  · After icing, apply heat to the affected area before you exercise or as often as told by your health care provider. Use the heat source that your health care provider recommends, such as a moist heat pack or a heating pad.  ? Place a towel between your skin and the heat source.  ? Leave the heat on for 20-30 minutes.  ? Remove the heat if your skin turns bright red. This is especially important if you are unable to feel pain, heat, or cold. You may have a greater risk  of getting burned.  Activity  · Return to your normal activities as told by your health care provider. Ask your health care provider what activities are safe for you.  ? Avoid activities that make your symptoms worse.  · Take brief periods of rest throughout the day. Resting in a lying or standing position is usually better than sitting to rest.  ? When you rest for longer periods, mix in some mild activity or stretching between periods of rest. This will help to prevent stiffness and pain.  ? Avoid sitting for long periods of time without moving. Get up and move around at least one time each hour.  · Exercise and stretch regularly, as told by your health care provider.  · Do not lift anything that is heavier than 10 lb (4.5 kg) while you have symptoms of sciatica. When you do not have symptoms, you should still avoid heavy lifting, especially repetitive heavy lifting.  · When you lift objects, always use proper lifting technique, which includes:  ? Bending your knees.  ? Keeping the load close to your body.  ? Avoiding twisting.  General instructions  · Use good posture.  ? Avoid leaning forward while sitting.  ? Avoid hunching over while standing.  · Maintain a healthy weight. Excess weight puts extra stress on your back and makes it difficult to maintain good posture.  · Wear supportive, comfortable shoes. Avoid wearing high heels.  · Avoid sleeping on a mattress that is too soft or too hard. A mattress that is firm enough to support your back when you sleep may help to reduce your pain.  · Keep all follow-up visits as told by your health care provider. This is important.  Contact a health care provider if:  · You have pain that wakes you up when you are sleeping.  · You have pain that gets worse when you lie down.  · Your pain is worse than you have experienced in the past.  · Your pain lasts longer than 4 weeks.  · You experience unexplained weight loss.  Get help right away if:  · You lose control of your  bowel or bladder (incontinence).  · You have:  ? Weakness in your lower back, pelvis, buttocks, or legs that gets worse.  ? Redness or swelling of your back.  ? A burning sensation when you urinate.  This information is not intended to replace advice given to you by your health care provider. Make sure you discuss any questions you have with your health care provider.  Document Released: 02/16/2001 Document Revised: 07/29/2015 Document Reviewed: 11/01/2014  Elsevier Interactive Patient Education ©   2019 Elsevier Inc.

## 2018-02-22 NOTE — Progress Notes (Signed)
Patient: Kim Conner Female    DOB: 08-14-1975   42 y.o.   MRN: [id number] Visit Date: 02/22/2018  Today's Provider: Lavon Paganini, MD   Chief Complaint  Patient presents with  . Back Pain   Subjective:    I, Tiburcio Pea, CMA, am acting as a scribe for Lavon Paganini, MD.   Back Pain  This is a new problem. The current episode started more than 1 year ago. The problem occurs intermittently. The problem has been gradually worsening since onset. The pain is present in the lumbar spine. The quality of the pain is described as aching. The pain radiates to the right thigh and right knee (right hip). Worse during: worse in the morning. Stiffness is present in the morning. She has tried heat (hot showers) for the symptoms. The treatment provided mild relief.    Worse with bending or physical activity Better with heat Not taking medications due to breastfeeding  Never had imaging or PT   Allergies  Allergen Reactions  . Chromium Dermatitis, Hives, Itching and Rash  . Cobalt Dermatitis, Hives, Itching and Rash  . Nickel Dermatitis, Hives, Itching and Rash  . Nitrofurantoin Nausea And Vomiting and Rash     Current Outpatient Medications:  .  Cholecalciferol (VITAMIN D3) 5000 units CAPS, Take 1 capsule (5,000 Units total) by mouth daily., Disp: 120 capsule, Rfl: 2  Review of Systems  Constitutional: Negative.   Respiratory: Negative.   Cardiovascular: Negative.   Musculoskeletal: Positive for back pain.    Social History   Tobacco Use  . Smoking status: Never Smoker  . Smokeless tobacco: Never Used  Substance Use Topics  . Alcohol use: Yes    Comment: occasional      Objective:   BP 130/89 (BP Location: Right Arm, Patient Position: Sitting, Cuff Size: Normal)   Pulse 85   Temp 98.3 F (36.8 C) (Oral)   Wt 127 lb 12.8 oz (58 kg)   SpO2 99%   BMI 21.94 kg/m  Vitals:   02/22/18 1321  BP: 130/89  Pulse: 85  Temp: 98.3 F (36.8 C)    TempSrc: Oral  SpO2: 99%  Weight: 127 lb 12.8 oz (58 kg)     Physical Exam Vitals signs reviewed.  Constitutional:      General: She is not in acute distress.    Appearance: Normal appearance.  HENT:     Head: Normocephalic and atraumatic.     Mouth/Throat:     Pharynx: Oropharynx is clear.  Eyes:     General: No scleral icterus.    Conjunctiva/sclera: Conjunctivae normal.  Neck:     Musculoskeletal: Neck supple.  Cardiovascular:     Rate and Rhythm: Normal rate and regular rhythm.     Pulses: Normal pulses.     Heart sounds: Normal heart sounds. No murmur.  Pulmonary:     Effort: Pulmonary effort is normal. No respiratory distress.     Breath sounds: Normal breath sounds. No wheezing or rhonchi.  Abdominal:     General: There is no distension.     Tenderness: There is no abdominal tenderness.  Musculoskeletal:     Right lower leg: No edema.     Left lower leg: No edema.     Comments: Back: No midline tenderness to palpation.  Paraspinal musculature is normal.  Tenderness to palpation over right SI joint and surrounding soft tissue.  Negative straight leg raise bilaterally.  Does have some pain in her right  lower back with right straight leg raise.  Strength is 5 out of 5 and symmetric in bilateral lower extremities.  Lymphadenopathy:     Cervical: No cervical adenopathy.  Skin:    General: Skin is warm and dry.     Capillary Refill: Capillary refill takes less than 2 seconds.     Findings: No rash.  Neurological:     General: No focal deficit present.     Mental Status: She is alert and oriented to person, place, and time.     Sensory: No sensory deficit.     Motor: No weakness.     Gait: Gait normal.  Psychiatric:        Mood and Affect: Mood normal.        Behavior: Behavior normal.        Assessment & Plan   1. Chronic right-sided low back pain with right-sided sciatica -Chronic and intermittent, but newly worked up - Pain is consistent with right-sided  sciatica -Patient with no red flag symptoms - Will try 1 week prednisone burst-discussed possible side effects including GI upset, insomnia, increased appetite and weight gain-also discussed this is safe in breast-feeding -We will also refer for physical therapy -Discussed return precautions -No indication for imaging at this time - Ambulatory referral to Physical Therapy    Meds ordered this encounter  Medications  . predniSONE (DELTASONE) 20 MG tablet    Sig: Take 2 tablets (40 mg total) by mouth daily with breakfast for 7 days.    Dispense:  14 tablet    Refill:  0     Return if symptoms worsen or fail to improve.   The entirety of the information documented in the History of Present Illness, Review of Systems and Physical Exam were personally obtained by me. Portions of this information were initially documented by Tiburcio Pea, CMA and reviewed by me for thoroughness and accuracy.    Virginia Crews, MD, MPH [facility] 02/22/2018 2:09 PM

## 2018-04-01 ENCOUNTER — Encounter: Payer: Self-pay | Admitting: Family Medicine

## 2018-04-13 ENCOUNTER — Encounter: Payer: Self-pay | Admitting: Physical Therapy

## 2018-04-13 ENCOUNTER — Ambulatory Visit: Payer: BLUE CROSS/BLUE SHIELD | Attending: Family Medicine | Admitting: Physical Therapy

## 2018-04-13 DIAGNOSIS — M6281 Muscle weakness (generalized): Secondary | ICD-10-CM | POA: Diagnosis present

## 2018-04-13 DIAGNOSIS — G8929 Other chronic pain: Secondary | ICD-10-CM | POA: Insufficient documentation

## 2018-04-13 DIAGNOSIS — M2560 Stiffness of unspecified joint, not elsewhere classified: Secondary | ICD-10-CM | POA: Insufficient documentation

## 2018-04-13 DIAGNOSIS — M5441 Lumbago with sciatica, right side: Secondary | ICD-10-CM | POA: Insufficient documentation

## 2018-04-13 NOTE — Patient Instructions (Signed)
Access Code: [email or web address]  URL: [email or web address]  Date: 04/13/2018  Prepared by: Dorcas Carrow   Exercises  Seated Piriformis Stretch - 3 reps - 1 sets - 30 hold - 2x daily - 7x weekly  Seated Hamstring Stretch - 3 reps - 1 sets - 30 hold - 2x daily - 7x weekly

## 2018-04-13 NOTE — Therapy (Signed)
[facility] [address] [place], [place], [address] Phone: [phone]   Fax:  [phone]  Physical Therapy Evaluation  Patient Details  Name: Kim Conner MRN: [id number] Date of Birth: March 05, 1976 Referring Provider (PT): Dr. Brita Romp   Encounter Date: 04/13/2018  PT End of Session - 04/13/18 1438    Visit Number  1    Number of Visits  9    Date for PT Re-Evaluation  05/11/18    PT Start Time  3875    PT Stop Time  1527    PT Time Calculation (min)  62 min    Activity Tolerance  Patient tolerated treatment well;Patient limited by pain    Behavior During Therapy  [facility] for tasks assessed/performed       Past Medical History:  Diagnosis Date  . Allergy   . Gall stones   . Hypertension   . Iodine goiter   . Left ovarian cyst     Past Surgical History:  Procedure Laterality Date  . none      There were no vitals filed for this visit.   Subjective Assessment - 04/13/18 1431    Subjective  Pt. reports chronic h/o low back pain with right-sided LE radicular symptoms.  Pt. states pain was exacerbated after giving birth (10 months ago) and taking care of son.  Pt. states current pain is 3/10 NPS, worst pain 7-8/10 and best 0/10.     Pertinent History  Chronic low back pain for several years (no previous treatments reported).  Pt. currently on Prednisone taper for a week (benefits reported).  No MOI reported.  Pt. reports she is comfortable standing.  Pt. states she is a side and stomach sleeper with incr. stiffness/ pain in AM. Pt. has some pain at night when changing positions. Pt. denies B/B changes but reports radiating sx to hip and sometimes knee. Pt. enjoys going to the park with son and is not currently exercising.    Limitations  Sitting;Lifting;Standing;Walking;House hold activities    How long can you stand comfortably?  1-2 hours    Patient Stated Goals  Decrease low back pain/ radicular symptoms/ play and  lift son with no pain    Currently in Pain?  Yes    Pain Score  3     Pain Location  Back    Pain Orientation  Lower;Right    Pain Descriptors / Indicators  Aching    Pain Type  Chronic pain    Pain Radiating Towards  R LE    Pain Onset  More than a month ago    Pain Frequency  Intermittent    Aggravating Factors   Household chores/ increase activity/ lifting son.  Supine position at night    Pain Relieving Factors  Medications/ hot shower/ heat/ rest         [facility] PT Assessment - 04/13/18 0001      Assessment   Medical Diagnosis  Chronic right-sided low back pain with right-sided sciatica    Referring Provider (PT)  Dr. Brita Romp    Onset Date/Surgical Date  03/08/17    Prior Therapy  no      Precautions   Precautions  None      Restrictions   Weight Bearing Restrictions  No      Balance Screen   Has the patient fallen in the past 6 months  No      [place] residence  Prior Function   Level of Independence  Independent      Cognition   Overall Cognitive Status  Within Functional Limits for tasks assessed         Sensation: Good/symmetrical B LE sensation (tested dermatomes L2-S2)  AROM: Standing lumbar AROM: flexion WNL/ extension limited 50% (R SI discomfort)/ R lateral flexion WFL/ L lateral flexion 25% limited/ L and R rotn. WNL  MMT: B LE muscle strength 5/5 MMT (slight icnr. pain with R hip flexion)  Gait: No antalgic gait/ abnormalities  Flexibility: Tight R piriformis with incr. Sx No tightness noted in B HS  Special Tests: (-) SI thigh thrust   Palpation: (+) R SI joint tenderness/pain with overpressure.   CPAs/UPAs Pt.  Reported tenderness to R side L1-L5, and over SI joint with grade I-II mobilizations   Treatment:  Supine TrA activation, pelvic tilts and heel slides (added to HEP) 10x each Seated HS and piriformis stretching 1x30 seach   Objective measurements completed on examination:  See above findings.     PT Education - 04/13/18 1519    Education Details  See HEP/ page #1 of TrA ex, using ice/heat contrast, sleeping with pillow between knees    Person(s) Educated  Patient    Methods  Explanation;Demonstration    Comprehension  Verbalized understanding;Returned demonstration         PT Long Term Goals - 04/13/18 1442      PT LONG TERM GOAL #1   Title  Pt. will increase FOTO to 71 to improve pain-free functional mobility.     Baseline  FOTO: 59    Time  4    Period  Weeks    Status  New    Target Date  05/11/18      PT LONG TERM GOAL #2   Title  Pt. will be able to lift her child/ > 20 lbs with proper body/ lifting mechanics in order to improve pain-free ADLs and playing with son.    Baseline  Pt. has incr. pain when lifting son and states that "she feels like she is lifting wrong"    Time  4    Period  Weeks    Status  New    Target Date  05/11/18      PT LONG TERM GOAL #3   Title  Pt. will be independent in core strengthening HEP without pain/ radiating sx in order to     Baseline  Pt. was given basic TrA contraction HEP which will be progressed into independent HEP by discharge.    Time  4    Period  Weeks    Status  New    Target Date  05/11/18      PT LONG TERM GOAL #4   Title  Pt. will be able to sleep > 6 hours at night without incr. pain while changing positions in the bed.    Baseline  Pt. reports waking up a few times per night while changing positions in the bed. PT educated pt. on sleeping with pillow in between knees to decr. pain.    Time  4    Period  Weeks    Status  New    Target Date  05/11/18          Plan - 04/13/18 1439    Clinical Impression Statement  Pt. is a pleasant 43 y/o female with chronic LBP but incr. sx after giving birth to son 10 months ago and has radiating sx  down R hip and knee. Good/symmetrical B LE sensation.  Standing lumbar AROM: flexion WNL/ extension limited 50% (R SI discomfort)/ R lateral flexion  WFL/ L lateral flexion 25% limited/ L and R rotn. WNL.  B LE muscle strength 5/5 MMT (slight icnr. pain with R hip flexion).  (+) R SI joint tenderness/pain with overpressure. (-) Thigh thrust.  FOTO: initial 59, goal 71.      Clinical Presentation  Stable    Clinical Decision Making  Low    Rehab Potential  Good    PT Frequency  2x / week    PT Duration  4 weeks    PT Treatment/Interventions  ADLs/Self Care Home Management;Cryotherapy;Electrical Stimulation;Moist Heat;Functional mobility Arts administrator;Therapeutic activities;Therapeutic exercise;Balance training;Neuromuscular re-education;Manual techniques;Patient/family education;Passive range of motion;Dry needling;Joint Manipulations;Spinal Manipulations    PT Next Visit Plan  Functional test (lifting), progress core program/ pain-free mobility, manual therapy    PT Home Exercise Plan  See handouts       Patient will benefit from skilled therapeutic intervention in order to improve the following deficits and impairments:  Improper body mechanics, Pain, Decreased mobility, Postural dysfunction, Hypomobility, Decreased strength, Decreased range of motion, Decreased endurance, Decreased activity tolerance, Impaired flexibility  Visit Diagnosis: Chronic right-sided low back pain with right-sided sciatica  Joint stiffness  Muscle weakness     Problem List Patient Active Problem List   Diagnosis Date Noted  . De Quervain's tenosynovitis, bilateral 01/18/2018  . Chronic bilateral low back pain without sciatica 01/18/2018  . Thyroid goiter 11/23/2016  . Gallstones 11/23/2016  . Atypical squamous cell changes of undetermined significance (ASCUS) on vaginal cytology 11/23/2016   Pura Spice, PT, DPT # [id number] [place], SPT 04/13/2018, 4:07 PM  [facility] [address] [place], [place], [address] Phone: [phone]   Fax:  [phone]  Name: Kerryn Tennant MRN: [id number] Date of Birth: 07-28-1975

## 2018-04-20 ENCOUNTER — Ambulatory Visit: Payer: BLUE CROSS/BLUE SHIELD | Admitting: Physical Therapy

## 2018-04-25 ENCOUNTER — Ambulatory Visit: Payer: BLUE CROSS/BLUE SHIELD | Admitting: Internal Medicine

## 2018-04-26 ENCOUNTER — Ambulatory Visit: Payer: BLUE CROSS/BLUE SHIELD | Admitting: Internal Medicine

## 2018-04-28 ENCOUNTER — Other Ambulatory Visit: Payer: Self-pay

## 2018-04-28 ENCOUNTER — Ambulatory Visit (INDEPENDENT_AMBULATORY_CARE_PROVIDER_SITE_OTHER): Payer: BLUE CROSS/BLUE SHIELD | Admitting: Internal Medicine

## 2018-04-28 ENCOUNTER — Encounter: Payer: Self-pay | Admitting: Internal Medicine

## 2018-04-28 VITALS — BP 110/70 | HR 81 | Resp 16 | Ht 64.0 in | Wt 126.4 lb

## 2018-04-28 DIAGNOSIS — E049 Nontoxic goiter, unspecified: Secondary | ICD-10-CM

## 2018-04-28 DIAGNOSIS — Z923 Personal history of irradiation: Secondary | ICD-10-CM

## 2018-04-28 LAB — T4, FREE: FREE T4: 0.8 ng/dL (ref 0.60–1.60)

## 2018-04-28 LAB — TSH: TSH: 0.77 u[IU]/mL (ref 0.35–4.50)

## 2018-04-28 NOTE — Progress Notes (Signed)
Name: Kim Conner  MRN/ DOB: [id number], October 07, 1975    Age/ Sex: 43 y.o., female     PCP: Virginia Crews, MD   Reason for Endocrinology Evaluation: Thyromegaly, Hx of hyperthyroidism     Initial Endocrinology Clinic Visit: 12/24/2017    PATIENT IDENTIFIER: Kim Conner is a 43 y.o., female with a past medical history of Hyperthyroidism . She has followed with [facility] since 12/24/17 for consultative assistance with management of her Thyroid Goiter.  HISTORICAL SUMMARY: The patient was first diagnosed with hyperthyroidism and goiter at age 43.She has required a couple treatment of RAI ablation.   Patient states she was on LT-4 replacement prior to her RAI ablation ? Marland Kitchen Following the RAI ablation she did not require LT-4 replacement but has been advised by her endocrinologist in [place] to take Iodine which she has been doing for years.   She has a thyroid enlargement since her teenage years.Over the years she has had serial thyroid ultrasound but no biopsy have been performed.   On her initial visit to [place], she was advised to stop the iodine supplements .   Her mother and sister have goiter but no FH of thyroid disease or other endocrine disorder.  SUBJECTIVE:   During last visit (12/22/2017): She was taking OTC iodine supplements at the time, she was advised to stop it.   Today (04/30/2018):  Kim Conner is here for  A follow up on MNG and Hx of RAI ablation as a teenager. She had stopped iodine supplements, months  Ago.   She denies any hyper or hypothyroid symptoms, except for the occasional palpitations.    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 Denies fatigue, constipation, or depression.     ROS:  As per HPI.   HISTORY:  Past Medical History:  Past Medical History:  Diagnosis Date  . Allergy   . Gall stones   . Hypertension   . Iodine goiter   . Left ovarian cyst    Past  Surgical History:  Past Surgical History:  Procedure Laterality Date  . none      Social History:  reports that she has never smoked. She has never used smokeless tobacco. She reports current alcohol use. She reports that she does not use drugs. Family History:  Family History  Problem Relation Age of Onset  . Hyperlipidemia Mother   . Hypertension Mother   . Migraines Mother   . Depression Mother   . Gallstones Mother   . Goiter Mother   . Cancer Father        prostate  . Hypertension Father   . Gallstones Father   . Clotting disorder Father   . Goiter Father   . Breast cancer Maternal Aunt   . Stomach cancer Maternal Aunt   . Cirrhosis Maternal Uncle   . Pancreatic cancer Paternal Uncle   . Colon cancer Neg Hx      HOME MEDICATIONS: Allergies as of 04/28/2018      Reactions   Chromium Dermatitis, Hives, Itching, Rash   Cobalt Dermatitis, Hives, Itching, Rash   Nickel Dermatitis, Hives, Itching, Rash   Nitrofurantoin Nausea And Vomiting, Rash      Medication List       Accurate as of April 28, 2018 11:59 PM. Always use your most recent med list.        Vitamin D3 125 MCG (5000 UT) Caps Take 1 capsule (5,000 Units total)  by mouth daily.         OBJECTIVE:   PHYSICAL EXAM: VS: BP 110/70 (BP Location: Left Arm, Patient Position: Sitting, Cuff Size: Normal)   Pulse 81   Resp 16   Ht 5\' 4"  (1.626 m)   Wt 126 lb 6.4 oz (57.3 kg)   LMP 04/08/2018   SpO2 98%   BMI 21.70 kg/m    EXAM: General: Pt appears well and is in NAD  Neck: General: Supple without adenopathy. Thyroid: Thyroid size is enlarged ~ 40 grams  No nodules appreciated. No thyroid bruit.  Lungs: Clear with good BS bilat with no rales, rhonchi, or wheezes  Heart: Auscultation: RRR.  Abdomen: Normoactive bowel sounds, soft, nontender, without masses or organomegaly palpable  Extremities:  BL LE: No pretibial edema normal ROM and strength.  Skin: Hair: Texture and amount normal with  gender appropriate distribution Skin Inspection: No rashes. Skin Palpation: Skin temperature, texture, and thickness normal to palpation  Neuro: Cranial nerves: II - XII grossly intact  Motor: Normal strength throughout DTRs: 2+ and symmetric in UE without delay in relaxation phase  Mental Status: Judgment, insight: Intact Orientation: Oriented to time, place, and person Mood and affect: No depression, anxiety, or agitation     DATA REVIEWED: Results for Kim Conner, Kim Conner (MRN [id number]) as of 04/30/2018 12:24  Ref. Range 04/28/2018 11:23  TSH Latest Ref Range: 0.35 - 4.50 uIU/mL 0.77  T4,Free(Direct) Latest Ref Range: 0.60 - 1.60 ng/dL 0.80   Thyroid Ultrasound 01/25/2018 Parenchymal Echotexture: Mildly heterogenous  Isthmus: 0.3 cm thickness  Right lobe: 6.1 x 1.7 x 2.1 cm  Left lobe: 5.9 x 1.3 x 1.9 cm  _________________________________________________________  Estimated total number of nodules >/= 1 cm: 1  Number of spongiform nodules >/=  2 cm not described below (TR1): 0  Number of mixed cystic and solid nodules >/= 1.5 cm not described below (TR2): 0  _________________________________________________________  Nodule # 1:  Location: Right; Mid  Maximum size: 1 cm; Other 2 dimensions: 0.7 x 0.5 cm  Composition: mixed cystic and solid (1)  Echogenicity: hypoechoic (2)  Shape: not taller-than-wide (0)  Margins: smooth (0)  Echogenic foci: none (0)  ACR TI-RADS total points: 3.  ACR TI-RADS risk category: TR3 (3 points).  ACR TI-RADS recommendations:  Given size (<1.4 cm) and appearance, this nodule does NOT meet TI-RADS criteria for biopsy or dedicated follow-up.  _________________________________________________________  Additional scattered hypoechoic and cystic lesions all less than 0.7 cm.  IMPRESSION: 1. Mild thyromegaly with scattered small cysts and nodules. None meets criteria for biopsy or dedicated imaging  follow-up.  ASSESSMENT / PLAN / RECOMMENDATIONS:   1. Hx of hyperthyroidism S/P RAI x 2 as a Teenager:  - Clinically and bio-chemically euthyroid - Denies any local neck symptoms.  - Continue to avoid iodine supplements.  -Labs from today reviewed.   2. Thyroid Goiter: - She denies any local neck symptoms  - Stable in size since her teenage years - I have discussed with her that with 2 RAI ablation treatments there's a high risk for false positive results on cytology reports. My recommendation at this time is not to biopsy any nodules unless there are clear concerning features on ultrasound such as lymphadenopathy.  - Ultrasound done in 01/2018 suggested no further follow up needed.  - Patient to notify us with any local neck symptoms such as sudden increase in size, pain, voice hoarseness, sob.   F/U in 1 year.     Signed electronically  by: Mack Guise, MD  [facility] Endocrinology  [facility] [address] [place], [place] [address] Phone: [phone] FAX: [phone]      CC: Virginia Crews, [place] [address] [place] [address] Phone: [phone]  Fax: [phone]   Return to Endocrinology clinic as below: Future Appointments  Date Time Provider [place]  11/21/2018  2:00 PM Joylene Igo, CNM [facility] None  01/22/2019  3:00 PM Brita Romp Dionne Bucy, MD [facility] None  04/30/2019 11:10 AM Shamleffer, Melanie Crazier, MD [facility] None

## 2018-05-04 ENCOUNTER — Encounter: Payer: Self-pay | Admitting: Family Medicine

## 2018-05-05 MED ORDER — PREDNISONE 20 MG PO TABS: 40.0000 mg | ORAL_TABLET | Freq: Every day | ORAL | 0 refills | Status: AC

## 2018-05-31 ENCOUNTER — Encounter: Payer: Self-pay | Admitting: Family Medicine

## 2018-11-21 ENCOUNTER — Other Ambulatory Visit (HOSPITAL_COMMUNITY)
Admission: RE | Admit: 2018-11-21 | Discharge: 2018-11-21 | Disposition: A | Discharge status: Home / Self Care | Payer: BC Managed Care – PPO | Source: Ambulatory Visit | Attending: Obstetrics and Gynecology | Admitting: Obstetrics and Gynecology

## 2018-11-21 ENCOUNTER — Encounter: Payer: Self-pay | Admitting: Obstetrics and Gynecology

## 2018-11-21 ENCOUNTER — Ambulatory Visit (INDEPENDENT_AMBULATORY_CARE_PROVIDER_SITE_OTHER): Payer: BC Managed Care – PPO | Admitting: Obstetrics and Gynecology

## 2018-11-21 ENCOUNTER — Other Ambulatory Visit: Payer: Self-pay

## 2018-11-21 VITALS — BP 140/85 | HR 81 | Ht 64.0 in | Wt 127.5 lb

## 2018-11-21 DIAGNOSIS — Z23 Encounter for immunization: Secondary | ICD-10-CM

## 2018-11-21 DIAGNOSIS — Z30011 Encounter for initial prescription of contraceptive pills: Secondary | ICD-10-CM

## 2018-11-21 DIAGNOSIS — Z01419 Encounter for gynecological examination (general) (routine) without abnormal findings: Secondary | ICD-10-CM | POA: Insufficient documentation

## 2018-11-21 MED ORDER — NORETHINDRONE 0.35 MG PO TABS: 1.0000 | ORAL_TABLET | Freq: Every day | ORAL | 11 refills | Status: DC

## 2018-11-21 NOTE — Patient Instructions (Signed)
 Preventive Care 21-41 Years Old, Female Preventive care refers to visits with your health care provider and lifestyle choices that can promote health and wellness. This includes:  A yearly physical exam. This may also be called an annual well check.  Regular dental visits and eye exams.  Immunizations.  Screening for certain conditions.  Healthy lifestyle choices, such as eating a healthy diet, getting regular exercise, not using drugs or products that contain nicotine and tobacco, and limiting alcohol use. What can I expect for my preventive care visit? Physical exam Your health care provider will check your:  Height and weight. This may be used to calculate body mass index (BMI), which tells if you are at a healthy weight.  Heart rate and blood pressure.  Skin for abnormal spots. Counseling Your health care provider may ask you questions about your:  Alcohol, tobacco, and drug use.  Emotional well-being.  Home and relationship well-being.  Sexual activity.  Eating habits.  Work and work environment.  Method of birth control.  Menstrual cycle.  Pregnancy history. What immunizations do I need?  Influenza (flu) vaccine  This is recommended every year. Tetanus, diphtheria, and pertussis (Tdap) vaccine  You may need a Td booster every 10 years. Varicella (chickenpox) vaccine  You may need this if you have not been vaccinated. Human papillomavirus (HPV) vaccine  If recommended by your health care provider, you may need three doses over 6 months. Measles, mumps, and rubella (MMR) vaccine  You may need at least one dose of MMR. You may also need a second dose. Meningococcal conjugate (MenACWY) vaccine  One dose is recommended if you are age 19-21 years and a first-year college student living in a residence hall, or if you have one of several medical conditions. You may also need additional booster doses. Pneumococcal conjugate (PCV13) vaccine  You may need  this if you have certain conditions and were not previously vaccinated. Pneumococcal polysaccharide (PPSV23) vaccine  You may need one or two doses if you smoke cigarettes or if you have certain conditions. Hepatitis A vaccine  You may need this if you have certain conditions or if you travel or work in places where you may be exposed to hepatitis A. Hepatitis B vaccine  You may need this if you have certain conditions or if you travel or work in places where you may be exposed to hepatitis B. Haemophilus influenzae type b (Hib) vaccine  You may need this if you have certain conditions. You may receive vaccines as individual doses or as more than one vaccine together in one shot (combination vaccines). Talk with your health care provider about the risks and benefits of combination vaccines. What tests do I need?  Blood tests  Lipid and cholesterol levels. These may be checked every 5 years starting at age 20.  Hepatitis C test.  Hepatitis B test. Screening  Diabetes screening. This is done by checking your blood sugar (glucose) after you have not eaten for a while (fasting).  Sexually transmitted disease (STD) testing.  BRCA-related cancer screening. This may be done if you have a family history of breast, ovarian, tubal, or peritoneal cancers.  Pelvic exam and Pap test. This may be done every 3 years starting at age 21. Starting at age 30, this may be done every 5 years if you have a Pap test in combination with an HPV test. Talk with your health care provider about your test results, treatment options, and if necessary, the need for more   tests. Follow these instructions at home: Eating and drinking   Eat a diet that includes fresh fruits and vegetables, whole grains, lean protein, and low-fat dairy.  Take vitamin and mineral supplements as recommended by your health care provider.  Do not drink alcohol if: ? Your health care provider tells you not to drink. ? You are  pregnant, may be pregnant, or are planning to become pregnant.  If you drink alcohol: ? Limit how much you have to 0-1 drink a day. ? Be aware of how much alcohol is in your drink. In the [place] one drink equals one 12 oz bottle of beer (355 mL), one 5 oz glass of wine (148 mL), or one 1 oz glass of hard liquor (44 mL). Lifestyle  Take daily care of your teeth and gums.  Stay active. Exercise for at least 30 minutes on 5 or more days each week.  Do not use any products that contain nicotine or tobacco, such as cigarettes, e-cigarettes, and chewing tobacco. If you need help quitting, ask your health care provider.  If you are sexually active, practice safe sex. Use a condom or other form of birth control (contraception) in order to prevent pregnancy and STIs (sexually transmitted infections). If you plan to become pregnant, see your health care provider for a preconception visit. What's next?  Visit your health care provider once a year for a well check visit.  Ask your health care provider how often you should have your eyes and teeth checked.  Stay up to date on all vaccines. This information is not intended to replace advice given to you by your health care provider. Make sure you discuss any questions you have with your health care provider. Document Released: 04/20/2001 Document Revised: 11/03/2017 Document Reviewed: 11/03/2017 Elsevier Patient Education  2020 [employer].

## 2018-11-21 NOTE — Progress Notes (Signed)
Subjective:   Kim Conner is a 43 y.o. G13P1001 Caucasian female here for a routine well-woman exam.  Patient's last menstrual period was 11/03/2018.    Current complaints: desires restarting BC- OK with pills PCP: Bacigalupo       does desire labs  Social History: Sexual: heterosexual Marital Status: married Living situation: with family Occupation: homemaker Tobacco/alcohol: no tobacco use Illicit drugs: no history of illicit drug use  The following portions of the patient's history were reviewed and updated as appropriate: allergies, current medications, past family history, past medical history, past social history, past surgical history and problem list.  Past Medical History Past Medical History:  Diagnosis Date  . Allergy   . Gall stones   . Hypertension   . Iodine goiter   . Left ovarian cyst     Past Surgical History Past Surgical History:  Procedure Laterality Date  . none      Gynecologic History G1P1001  Patient's last menstrual period was 11/03/2018. Contraception: coitus interruptus and condoms Last Pap: 12/2016. Results were: normal   Obstetric History OB History  Gravida Para Term Preterm AB Living  1 1 1     1   SAB TAB Ectopic Multiple Live Births        0 1    # Outcome Date GA Lbr Len/2nd Weight Sex Delivery Anes PTL Lv  1 Term 06/15/17 [redacted]w[redacted]d / 09:30 7 lb 2.6 oz (3.25 kg) M Vag-Spont EPI, Local  LIV    Current Medications Current Outpatient Medications on File Prior to Visit  Medication Sig Dispense Refill  . Cholecalciferol (VITAMIN D3) 5000 units CAPS Take 1 capsule (5,000 Units total) by mouth daily. 120 capsule 2   No current facility-administered medications on file prior to visit.     Review of Systems Patient denies any headaches, blurred vision, shortness of breath, chest pain, abdominal pain, problems with bowel movements, urination, or intercourse.  Objective:  BP 140/85   Pulse 81   Ht 5\' 4"  (1.626 m)   Wt 127 lb  8 oz (57.8 kg)   LMP 11/03/2018   BMI 21.89 kg/m  Physical Exam  General:  Well developed, well nourished, no acute distress. She is alert and oriented x3. Skin:  Warm and dry Neck:  Midline trachea, no thyromegaly or nodules Cardiovascular: Regular rate and rhythm, no murmur heard Lungs:  Effort normal, all lung fields clear to auscultation bilaterally Breasts:  No dominant palpable mass, retraction, or nipple discharge Abdomen:  Soft, non tender, no hepatosplenomegaly or masses Pelvic:  External genitalia is normal in appearance.  The vagina is normal in appearance. The cervix is bulbous, no CMT.  Thin prep pap is done with HR HPV cotesting. Uterus is felt to be normal size, shape, and contour.  No adnexal masses or tenderness noted. Extremities:  No swelling or varicosities noted Psych:  She has a normal mood and affect  Assessment:   Healthy well-woman exam New start OCP Needs flu vaccine   Plan:  Will start camila progestin only pills after next menses Flu vaccine given  F/U 1 year for AE, or sooner if needed Mammogram will postpone due to breastfeeding 82 month old son.   Kaston Faughn Rockney Ghee, CNM

## 2018-11-22 LAB — COMPREHENSIVE METABOLIC PANEL
ALT: 8 IU/L (ref 0–32)
AST: 14 IU/L (ref 0–40)
Albumin/Globulin Ratio: 1.5 (ref 1.2–2.2)
Albumin: 4.6 g/dL (ref 3.8–4.8)
Alkaline Phosphatase: 83 IU/L (ref 39–117)
BUN/Creatinine Ratio: 16 (ref 9–23)
BUN: 14 mg/dL (ref 6–24)
Bilirubin Total: 0.4 mg/dL (ref 0.0–1.2)
CO2: 25 mmol/L (ref 20–29)
Calcium: 9.1 mg/dL (ref 8.7–10.2)
Chloride: 98 mmol/L (ref 96–106)
Creatinine, Ser: 0.87 mg/dL (ref 0.57–1.00)
GFR calc Af Amer: 96 mL/min/{1.73_m2} (ref >59)
GFR calc non Af Amer: 83 mL/min/{1.73_m2} (ref >59)
Globulin, Total: 3 g/dL (ref 1.5–4.5)
Glucose: 92 mg/dL (ref 65–99)
Potassium: 4.3 mmol/L (ref 3.5–5.2)
Sodium: 139 mmol/L (ref 134–144)
Total Protein: 7.6 g/dL (ref 6.0–8.5)

## 2018-11-22 LAB — CBC
Hematocrit: 43.5 % (ref 34.0–46.6)
Hemoglobin: 14.5 g/dL (ref 11.1–15.9)
MCH: 29.1 pg (ref 26.6–33.0)
MCHC: 33.3 g/dL (ref 31.5–35.7)
MCV: 87 fL (ref 79–97)
Platelets: 380 10*3/uL (ref 150–450)
RBC: 4.99 x10E6/uL (ref 3.77–5.28)
RDW: 12.7 % (ref 11.7–15.4)
WBC: 9.7 10*3/uL (ref 3.4–10.8)

## 2018-11-22 LAB — THYROID PANEL WITH TSH
Free Thyroxine Index: 2 (ref 1.2–4.9)
T3 Uptake Ratio: 26 % (ref 24–39)
T4, Total: 7.7 ug/dL (ref 4.5–12.0)
TSH: 0.985 u[IU]/mL (ref 0.450–4.500)

## 2018-11-22 LAB — VITAMIN D 25 HYDROXY (VIT D DEFICIENCY, FRACTURES): Vit D, 25-Hydroxy: 45.2 ng/mL (ref 30.0–100.0)

## 2018-12-20 LAB — CYTOLOGY - PAP: Diagnosis: NEGATIVE

## 2019-01-22 ENCOUNTER — Encounter: Payer: Self-pay | Admitting: Family Medicine

## 2019-01-22 ENCOUNTER — Ambulatory Visit
Admission: RE | Admit: 2019-01-22 | Discharge: 2019-01-22 | Disposition: A | Discharge status: Home / Self Care | Payer: BC Managed Care – PPO | Source: Ambulatory Visit | Attending: Family Medicine | Admitting: Family Medicine

## 2019-01-22 ENCOUNTER — Other Ambulatory Visit: Payer: Self-pay

## 2019-01-22 ENCOUNTER — Ambulatory Visit (INDEPENDENT_AMBULATORY_CARE_PROVIDER_SITE_OTHER): Payer: BC Managed Care – PPO | Admitting: Family Medicine

## 2019-01-22 VITALS — BP 126/82 | HR 77 | Temp 97.7°F | Resp 16 | Ht 64.0 in | Wt 127.0 lb

## 2019-01-22 DIAGNOSIS — Z0000 Encounter for general adult medical examination without abnormal findings: Secondary | ICD-10-CM | POA: Diagnosis not present

## 2019-01-22 DIAGNOSIS — G8929 Other chronic pain: Secondary | ICD-10-CM | POA: Insufficient documentation

## 2019-01-22 DIAGNOSIS — D171 Benign lipomatous neoplasm of skin and subcutaneous tissue of trunk: Secondary | ICD-10-CM | POA: Insufficient documentation

## 2019-01-22 DIAGNOSIS — M545 Low back pain: Secondary | ICD-10-CM | POA: Diagnosis present

## 2019-01-22 DIAGNOSIS — Z83438 Family history of other disorder of lipoprotein metabolism and other lipidemia: Secondary | ICD-10-CM | POA: Insufficient documentation

## 2019-01-22 NOTE — Progress Notes (Signed)
Patient: Kim Conner, Female    DOB: 08/29/1975, 43 y.o.   MRN: [id number] Visit Date: 01/22/2019  Today's Provider: Lavon Paganini, MD   Chief Complaint  Patient presents with   Annual Exam   Subjective:     Annual physical exam Kim Conner is a 43 y.o. female who presents today for health maintenance and complete physical. She feels well. She reports exercising no. She reports she is sleeping well. 11/21/2018 Pap-negative GYN ----------------------------------------------------------------- Patient c/o back pain and a "lump" on her back.  Thinks it may be a lipoma.  Would like it removed. Getting bigger. Present for long time.  Was unable to go to PT due to cost for back pain.  Not currently taking any thing. Present all the time in low back. Worse with activity. Nothing seems to make it better. Unchanged, present for years.  Review of Systems  Constitutional: Negative.   HENT: Negative.   Eyes: Negative.   Respiratory: Negative.   Cardiovascular: Negative.   Gastrointestinal: Negative.   Endocrine: Negative.   Genitourinary: Negative.   Musculoskeletal: Positive for back pain.  Allergic/Immunologic: Negative.   Neurological: Negative.   Hematological: Negative.   Psychiatric/Behavioral: Negative.     Social History      She  reports that she has never smoked. She has never used smokeless tobacco. She reports current alcohol use. She reports that she does not use drugs.       Social History   Socioeconomic History   Marital status: Married    Spouse name: Not on file   Number of children: Not on file   Years of education: Not on file   Highest education level: Not on file  Occupational History   Not on file  Social Needs   Financial resource strain: Not on file   Food insecurity    Worry: Not on file    Inability: Not on file   Transportation needs    Medical: Not on file    Non-medical: Not on file  Tobacco Use    Smoking status: Never Smoker   Smokeless tobacco: Never Used  Substance and Sexual Activity   Alcohol use: Yes    Comment: occasional   Drug use: No   Sexual activity: Yes    Partners: Male    Birth control/protection: Condom  Lifestyle   Physical activity    Days per week: Not on file    Minutes per session: Not on file   Stress: Not on file  Relationships   Social connections    Talks on phone: Not on file    Gets together: Not on file    Attends religious service: Not on file    Active member of club or organization: Not on file    Attends meetings of clubs or organizations: Not on file    Relationship status: Not on file  Other Topics Concern   Not on file  Social History Narrative   Not on file    Past Medical History:  Diagnosis Date   Allergy    Gall stones    Hypertension    Iodine goiter    Left ovarian cyst      Patient Active Problem List   Diagnosis Date Noted   De Quervain's tenosynovitis, bilateral 01/18/2018   Chronic bilateral low back pain without sciatica 01/18/2018   Thyroid goiter 11/23/2016   Gallstones 11/23/2016   Atypical squamous cell changes of undetermined significance (ASCUS) on vaginal cytology  11/23/2016    Past Surgical History:  Procedure Laterality Date   none      Family History        Family Status  Relation Name Status   Mother  (Not Specified)   Father  Deceased   Mat Aunt  (Not Specified)   Mat Uncle  (Not Specified)   Annamarie Major  (Not Specified)   Neg Hx  (Not Specified)        Her family history includes Breast cancer in her maternal aunt; Cancer in her father; Cirrhosis in her maternal uncle; Clotting disorder in her father; Depression in her mother; Gallstones in her father and mother; Goiter in her father and mother; Hyperlipidemia in her mother; Hypertension in her father and mother; Migraines in her mother; Pancreatic cancer in her paternal uncle; Stomach cancer in her maternal aunt.  There is no history of Colon cancer.      Allergies  Allergen Reactions   Chromium Dermatitis, Hives, Itching and Rash   Cobalt Dermatitis, Hives, Itching and Rash   Nickel Dermatitis, Hives, Itching and Rash   Nitrofurantoin Nausea And Vomiting and Rash     Current Outpatient Medications:    Cholecalciferol (VITAMIN D3) 5000 units CAPS, Take 1 capsule (5,000 Units total) by mouth daily., Disp: 120 capsule, Rfl: 2   norethindrone (MICRONOR) 0.35 MG tablet, Take 1 tablet (0.35 mg total) by mouth daily., Disp: 1 Package, Rfl: 11   Patient Care Team: Virginia Crews, MD as PCP - General (Family Medicine)    Objective:    Vitals: BP 126/82 (BP Location: Left Arm, Patient Position: Sitting, Cuff Size: Normal)    Pulse 77    Temp 97.7 F (36.5 C) (Temporal)    Resp 16    Ht 5\' 4"  (1.626 m)    Wt 127 lb (57.6 kg)    BMI 21.80 kg/m    Vitals:   01/22/19 1503  BP: 126/82  Pulse: 77  Resp: 16  Temp: 97.7 F (36.5 C)  TempSrc: Temporal  Weight: 127 lb (57.6 kg)  Height: 5\' 4"  (1.626 m)     Physical Exam Vitals signs reviewed.  Constitutional:      General: She is not in acute distress.    Appearance: Normal appearance. She is well-developed. She is not diaphoretic.  HENT:     Head: Normocephalic and atraumatic.     Right Ear: Tympanic membrane, ear canal and external ear normal.     Left Ear: Tympanic membrane, ear canal and external ear normal.  Eyes:     General: No scleral icterus.    Conjunctiva/sclera: Conjunctivae normal.     Pupils: Pupils are equal, round, and reactive to light.  Neck:     Musculoskeletal: Neck supple.     Thyroid: No thyromegaly.  Cardiovascular:     Rate and Rhythm: Normal rate and regular rhythm.     Pulses: Normal pulses.     Heart sounds: Normal heart sounds. No murmur.  Pulmonary:     Effort: Pulmonary effort is normal. No respiratory distress.     Breath sounds: Normal breath sounds. No wheezing or rales.  Abdominal:      General: There is no distension.     Palpations: Abdomen is soft.     Tenderness: There is no abdominal tenderness.  Musculoskeletal:        General: No deformity.     Right lower leg: No edema.     Left lower leg: No edema.  Comments: Back: No midline TTP, No SI joint TTP.NEgative SLR bilaterally.  Tightness of R hamstrings, weakness of R abductors of hip.  Lymphadenopathy:     Cervical: No cervical adenopathy.  Skin:    General: Skin is warm and dry.     Capillary Refill: Capillary refill takes less than 2 seconds.     Findings: No rash.     Comments: 1cm mobile, subcutaneous lump on L upper back, no drainage, no TTP  Neurological:     Mental Status: She is alert and oriented to person, place, and time. Mental status is at baseline.     Cranial Nerves: No cranial nerve deficit.     Sensory: No sensory deficit.     Motor: No weakness.     Gait: Gait normal.     Deep Tendon Reflexes: Reflexes normal.  Psychiatric:        Mood and Affect: Mood normal.        Behavior: Behavior normal.        Thought Content: Thought content normal.      Depression Screen PHQ 2/9 Scores 01/22/2019 07/25/2018 01/18/2018 07/27/2017  PHQ - 2 Score 0 0 0 1  PHQ- 9 Score 2 - 3 2       Assessment & Plan:     Routine Health Maintenance and Physical Exam  Exercise Activities and Dietary recommendations Goals   None     Immunization History  Administered Date(s) Administered   Influenza,inj,Quad PF,6+ Mos 01/12/2017, 01/18/2018, 11/21/2018   Tdap 04/07/2017    Health Maintenance  Topic Date Due   PAP SMEAR-Modifier  11/20/2021   TETANUS/TDAP  04/08/2027   INFLUENZA VACCINE  Completed   HIV Screening  Completed     Discussed health benefits of physical activity, and encouraged her to engage in regular exercise appropriate for her age and condition.    --------------------------------------------------------------------  Problem List Items Addressed This Visit       Other   Chronic bilateral low back pain without sciatica    Benign exam, neuro intact No radicular symptoms Again discussed that this is likely muscular in etiology Discussed that I cannot predict how her back pain would do with another pregnancy, but low back pain is common during pregnancy Discussed HEP (handouts for LBP and SI joint pain given), tylenol prn, heat, yoga/core strengthening Unable to afford PT Given persistent nature, will obtain XRays to ensure no bony process Avoid NSAIDs as she is breastfeeding Return precautions discussed      Relevant Orders   DG Lumbar Spine Complete   Lipoma of torso    New problem, but present for many months to years and enlarging in size Referral to Gen surg for possible excision      Relevant Orders   Ambulatory referral to General Surgery   Family history of hyperlipidemia    Continue to monitor annual FLP Encouraged diet and exercise      Relevant Orders   Lipid panel    Other Visit Diagnoses    Routine general medical examination at a health care facility    -  Primary   Relevant Orders   Lipid panel       Return in 1 year (on 01/22/2020).   The entirety of the information documented in the History of Present Illness, Review of Systems and Physical Exam were personally obtained by me. Portions of this information were initially documented by Lynford Humphrey , CMA and reviewed by me for thoroughness and accuracy.  Natanael Saladin, Dionne Bucy, MD MPH [facility]

## 2019-01-22 NOTE — Assessment & Plan Note (Signed)
Continue to monitor annual FLP Encouraged diet and exercise

## 2019-01-22 NOTE — Assessment & Plan Note (Signed)
Benign exam, neuro intact No radicular symptoms Again discussed that this is likely muscular in etiology Discussed that I cannot predict how her back pain would do with another pregnancy, but low back pain is common during pregnancy Discussed HEP (handouts for LBP and SI joint pain given), tylenol prn, heat, yoga/core strengthening Unable to afford PT Given persistent nature, will obtain XRays to ensure no bony process Avoid NSAIDs as she is breastfeeding Return precautions discussed

## 2019-01-22 NOTE — Assessment & Plan Note (Signed)
New problem, but present for many months to years and enlarging in size Referral to Gen surg for possible excision

## 2019-01-22 NOTE — Patient Instructions (Signed)
Health Maintenance, Female Adopting a healthy lifestyle and getting preventive care are important in promoting health and wellness. Ask your health care provider about:  The right schedule for you to have regular tests and exams.  Things you can do on your own to prevent diseases and keep yourself healthy. What should I know about diet, weight, and exercise? Eat a healthy diet   Eat a diet that includes plenty of vegetables, fruits, low-fat dairy products, and lean protein.  Do not eat a lot of foods that are high in solid fats, added sugars, or sodium. Maintain a healthy weight Body mass index (BMI) is used to identify weight problems. It estimates body fat based on height and weight. Your health care provider can help determine your BMI and help you achieve or maintain a healthy weight. Get regular exercise Get regular exercise. This is one of the most important things you can do for your health. Most adults should:  Exercise for at least 150 minutes each week. The exercise should increase your heart rate and make you sweat (moderate-intensity exercise).  Do strengthening exercises at least twice a week. This is in addition to the moderate-intensity exercise.  Spend less time sitting. Even light physical activity can be beneficial. Watch cholesterol and blood lipids Have your blood tested for lipids and cholesterol at 41 years of age, then have this test every 5 years. Have your cholesterol levels checked more often if:  Your lipid or cholesterol levels are high.  You are older than 40 years of age.  You are at high risk for heart disease. What should I know about cancer screening? Depending on your health history and family history, you may need to have cancer screening at various ages. This may include screening for:  Breast cancer.  Cervical cancer.  Colorectal cancer.  Skin cancer.  Lung cancer. What should I know about heart disease, diabetes, and high blood  pressure? Blood pressure and heart disease  High blood pressure causes heart disease and increases the risk of stroke. This is more likely to develop in people who have high blood pressure readings, are of African descent, or are overweight.  Have your blood pressure checked: ? Every 3-5 years if you are 18-39 years of age. ? Every year if you are 40 years old or older. Diabetes Have regular diabetes screenings. This checks your fasting blood sugar level. Have the screening done:  Once every three years after age 40 if you are at a normal weight and have a low risk for diabetes.  More often and at a younger age if you are overweight or have a high risk for diabetes. What should I know about preventing infection? Hepatitis B If you have a higher risk for hepatitis B, you should be screened for this virus. Talk with your health care provider to find out if you are at risk for hepatitis B infection. Hepatitis C Testing is recommended for:  Everyone born from 1945 through 1965.  Anyone with known risk factors for hepatitis C. Sexually transmitted infections (STIs)  Get screened for STIs, including gonorrhea and chlamydia, if: ? You are sexually active and are younger than 41 years of age. ? You are older than 41 years of age and your health care provider tells you that you are at risk for this type of infection. ? Your sexual activity has changed since you were last screened, and you are at increased risk for chlamydia or gonorrhea. Ask your health care provider if   you are at risk.  Ask your health care provider about whether you are at high risk for HIV. Your health care provider may recommend a prescription medicine to help prevent HIV infection. If you choose to take medicine to prevent HIV, you should first get tested for HIV. You should then be tested every 3 months for as long as you are taking the medicine. Pregnancy  If you are about to stop having your period (premenopausal) and  you may become pregnant, seek counseling before you get pregnant.  Take 400 to 800 micrograms (mcg) of folic acid every day if you become pregnant.  Ask for birth control (contraception) if you want to prevent pregnancy. Osteoporosis and menopause Osteoporosis is a disease in which the bones lose minerals and strength with aging. This can result in bone fractures. If you are 65 years old or older, or if you are at risk for osteoporosis and fractures, ask your health care provider if you should:  Be screened for bone loss.  Take a calcium or vitamin D supplement to lower your risk of fractures.  Be given hormone replacement therapy (HRT) to treat symptoms of menopause. Follow these instructions at home: Lifestyle  Do not use any products that contain nicotine or tobacco, such as cigarettes, e-cigarettes, and chewing tobacco. If you need help quitting, ask your health care provider.  Do not use street drugs.  Do not share needles.  Ask your health care provider for help if you need support or information about quitting drugs. Alcohol use  Do not drink alcohol if: ? Your health care provider tells you not to drink. ? You are pregnant, may be pregnant, or are planning to become pregnant.  If you drink alcohol: ? Limit how much you use to 0-1 drink a day. ? Limit intake if you are breastfeeding.  Be aware of how much alcohol is in your drink. In the [place] one drink equals one 12 oz bottle of beer (355 mL), one 5 oz glass of wine (148 mL), or one 1 oz glass of hard liquor (44 mL). General instructions  Schedule regular health, dental, and eye exams.  Stay current with your vaccines.  Tell your health care provider if: ? You often feel depressed. ? You have ever been abused or do not feel safe at home. Summary  Adopting a healthy lifestyle and getting preventive care are important in promoting health and wellness.  Follow your health care provider's instructions about healthy  diet, exercising, and getting tested or screened for diseases.  Follow your health care provider's instructions on monitoring your cholesterol and blood pressure. This information is not intended to replace advice given to you by your health care provider. Make sure you discuss any questions you have with your health care provider. Document Released: 09/07/2010 Document Revised: 02/15/2018 Document Reviewed: 02/15/2018 Elsevier Patient Education  2020 [employer]vier Inc[employer]  

## 2019-01-23 ENCOUNTER — Telehealth: Payer: Self-pay

## 2019-01-23 NOTE — Telephone Encounter (Signed)
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  Continue management as we discussed

## 2019-01-23 NOTE — Telephone Encounter (Signed)
Patient advised as below.  

## 2019-01-24 ENCOUNTER — Telehealth: Payer: Self-pay

## 2019-01-24 LAB — LIPID PANEL
Chol/HDL Ratio: 3.3 ratio (ref 0.0–4.4)
Cholesterol, Total: 172 mg/dL (ref 100–199)
HDL: 52 mg/dL
LDL Chol Calc (NIH): 102 mg/dL — ABNORMAL HIGH (ref 0–99)
Triglycerides: 97 mg/dL (ref 0–149)
VLDL Cholesterol Cal: 18 mg/dL (ref 5–40)

## 2019-01-24 NOTE — Telephone Encounter (Signed)
Viewed by Particia Lather on 01/24/2019 10:21 AM Written by Virginia Crews, MD on 01/24/2019 9:54 AM Normal labs

## 2019-01-24 NOTE — Telephone Encounter (Signed)
-----   Message from Virginia Crews, MD sent at 01/24/2019  9:54 AM EST ----- Normal labs

## 2019-01-30 ENCOUNTER — Encounter: Payer: Self-pay | Admitting: Surgery

## 2019-01-30 ENCOUNTER — Ambulatory Visit (INDEPENDENT_AMBULATORY_CARE_PROVIDER_SITE_OTHER): Payer: BC Managed Care – PPO | Admitting: Surgery

## 2019-01-30 ENCOUNTER — Other Ambulatory Visit: Payer: Self-pay

## 2019-01-30 VITALS — BP 137/87 | HR 73 | Temp 98.1°F | Resp 14 | Ht 64.0 in | Wt 130.6 lb

## 2019-01-30 DIAGNOSIS — D171 Benign lipomatous neoplasm of skin and subcutaneous tissue of trunk: Secondary | ICD-10-CM

## 2019-01-30 NOTE — Progress Notes (Signed)
01/30/2019  Reason for Visit:  Lipoma of the upper back  Referring Provider:  Lavon Paganini, MD  History of Present Illness: Kim Conner is a 43 y.o. female presenting for evaluation of left upper back mass.  Patient reports that she has noticed this for at least 2 years and it has not really grown in size.  She denies any particular discomfort from it.  However her dad has a history of a lipoma of the neck that grew quite a lot prior to excision and she does not want this to grow as big either and wants to be excised.  Denies any skin erythema or induration or drainage from that area.  Denies any pain.  Denies any other areas of masses like this.  Past Medical History: Past Medical History:  Diagnosis Date  . Allergy   . Gall stones   . Hypertension   . Iodine goiter   . Left ovarian cyst      Past Surgical History: Past Surgical History:  Procedure Laterality Date  . none      Home Medications: Prior to Admission medications   Medication Sig Start Date End Date Taking? Authorizing Provider  Cholecalciferol (VITAMIN D3) 5000 units CAPS Take 1 capsule (5,000 Units total) by mouth daily. 06/17/17  Yes Shambley, Melody N, CNM  norethindrone (MICRONOR) 0.35 MG tablet Take 1 tablet (0.35 mg total) by mouth daily. 11/21/18  Yes Shambley, Melody N, CNM    Allergies: Allergies  Allergen Reactions  . Chromium Dermatitis, Hives, Itching and Rash  . Cobalt Dermatitis, Hives, Itching and Rash  . Nickel Dermatitis, Hives, Itching and Rash  . Nitrofurantoin Nausea And Vomiting and Rash    Social History:  reports that she has never smoked. She has never used smokeless tobacco. She reports current alcohol use. She reports that she does not use drugs.   Family History: Family History  Problem Relation Age of Onset  . Hyperlipidemia Mother   . Hypertension Mother   . Migraines Mother   . Depression Mother   . Gallstones Mother   . Goiter Mother   . Cancer Father         prostate  . Hypertension Father   . Gallstones Father   . Clotting disorder Father   . Goiter Father   . Breast cancer Maternal Aunt   . Stomach cancer Maternal Aunt   . Cirrhosis Maternal Uncle   . Pancreatic cancer Paternal Uncle   . Colon cancer Neg Hx     Review of Systems: Review of Systems  Constitutional: Negative for chills and fever.  HENT: Negative for hearing loss.   Respiratory: Negative for shortness of breath.   Cardiovascular: Negative for chest pain.  Gastrointestinal: Negative for abdominal pain, nausea and vomiting.  Genitourinary: Negative for dysuria.  Musculoskeletal: Negative for myalgias.  Skin: Negative for rash.  Neurological: Negative for dizziness.  Psychiatric/Behavioral: Negative for depression.    Physical Exam BP 137/87   Pulse 73   Temp 98.1 F (36.7 C) (Temporal)   Resp 14   Ht 5\' 4"  (1.626 m)   Wt 130 lb 9.6 oz (59.2 kg)   LMP 01/14/2019   SpO2 99%   BMI 22.42 kg/m  CONSTITUTIONAL: No acute distress HEENT:  Normocephalic, atraumatic, extraocular motion intact. NECK: Trachea is midline, and there is no jugular venous distension.  RESPIRATORY:  Lungs are clear, and breath sounds are equal bilaterally. Normal respiratory effort without pathologic use of accessory muscles. CARDIOVASCULAR: Heart is regular without  murmurs, gallops, or rubs. GI: The abdomen is soft, nondistended, nontender.  MUSCULOSKELETAL:  Normal muscle strength and tone in all four extremities.  No peripheral edema or cyanosis. SKIN: Patient has a 2-1/2 cm flat appearing mass in the subcutaneous space that is mobile, nontender, and soft.  There is no overlying erythema, induration, or drainage.  This is at the level of the upper back with brassiere straps meet, to the left of midline. NEUROLOGIC:  Motor and sensation is grossly normal.  Cranial nerves are grossly intact. PSYCH:  Alert and oriented to person, place and time. Affect is normal.  Laboratory  Analysis: No results found for this or any previous visit (from the past 24 hour(s)).  Imaging: No results found.  Assessment and Plan: This is a 43 y.o. female with a left upper back lipoma.  -Discussed with the patient that we can definitely help her and excise this mass.  We can schedule her for an in office procedure.  Discussed with her the risks of bleeding, infection, injury to surrounding structures.  Discussed that this would be done under local anesthesia and that she will be able to drive home after that.  Discussed with her that we would use sutures underneath the skin and Dermabond glue on top to seal.  We will also send this mass to pathology to be tested to make sure there is nothing else besides a lipoma.  She is comfortable with this plan we will schedule her for an office procedure for 02/07/2019.  Face-to-face time spent with the patient and care providers was 40 minutes, with more than 50% of the time spent counseling, educating, and coordinating care of the patient.     Melvyn Neth, [facility]

## 2019-01-30 NOTE — Patient Instructions (Signed)
Lipoma Removal Lipoma removal is a surgical procedure to remove a noncancerous (benign) tumor that is made up of fat cells (lipoma). Most lipomas are small and painless and do not require treatment. They can form in many areas of the body but are most common under the skin of the back, shoulders, arms, and thighs. You may need lipoma removal if you have a lipoma that is large, growing, or causing discomfort. Lipoma removal may also be done for cosmetic reasons. Tell a health care provider about:  Any allergies you have.  All medicines you are taking, including vitamins, herbs, eye drops, creams, and over-the-counter medicines.  Any problems you or family members have had with anesthetic medicines.  Any blood disorders you have.  Any surgeries you have had.  Any medical conditions you have.  Whether you are pregnant or may be pregnant. What are the risks? Generally, this is a safe procedure. However, problems may occur, including:  Infection.  Bleeding.  Allergic reactions to medicines.  Damage to nerves or blood vessels near the lipoma.  Scarring. What happens before the procedure? Staying hydrated Follow instructions from your health care provider about hydration, which may include:  Up to 2 hours before the procedure - you may continue to drink clear liquids, such as water, clear fruit juice, black coffee, and plain tea. Eating and drinking restrictions Follow instructions from your health care provider about eating and drinking, which may include:  8 hours before the procedure - stop eating heavy meals or foods such as meat, fried foods, or fatty foods.  6 hours before the procedure - stop eating light meals or foods, such as toast or cereal.  6 hours before the procedure - stop drinking milk or drinks that contain milk.  2 hours before the procedure - stop drinking clear liquids. Medicines  Ask your health care provider about: ? Changing or stopping your regular  medicines. This is especially important if you are taking diabetes medicines or blood thinners. ? Taking medicines such as aspirin and ibuprofen. These medicines can thin your blood. Do not take these medicines before your procedure if your health care provider instructs you not to.  You may be given antibiotic medicine to help prevent infection. General instructions  Ask your health care provider how your surgical site will be marked or identified.  You will have a physical exam. Your health care provider will check the size of the lipoma and whether it can be moved easily.  You may have imaging tests, such as: ? X-rays. ? CT scan. ? MRI.  Plan to have someone take you home from the hospital or clinic. What happens during the procedure?  To reduce your risk of infection: ? Your health care team will wash or sanitize their hands. ? Your skin will be washed with soap.  You will be given one or more of the following: ? A medicine to help you relax (sedative). ? A medicine to numb the area (local anesthetic). ? A medicine to make you fall asleep (general anesthetic). ? A medicine that is injected into an area of your body to numb everything below the injection site (regional anesthetic).  An incision will be made over the lipoma or very near the lipoma. The incision may be made in a natural skin line or crease.  Tissues, nerves, and blood vessels near the lipoma will be moved out of the way.  The lipoma and the capsule that surrounds it will be separated from the surrounding  tissues.  The lipoma will be removed.  The incision may be closed with stitches (sutures).  A bandage (dressing) will be placed over the incision. What happens after the procedure?  Do not drive for 24 hours if you received a sedative.  Your blood pressure, heart rate, breathing rate, and blood oxygen level will be monitored until the medicines you were given have worn off. This information is not  intended to replace advice given to you by your health care provider. Make sure you discuss any questions you have with your health care provider. Document Released: 05/08/2015 Document Revised: 10/16/2015 Document Reviewed: 05/08/2015 Elsevier Patient Education  2020 [employer].

## 2019-02-07 ENCOUNTER — Encounter: Payer: Self-pay | Admitting: Surgery

## 2019-02-07 ENCOUNTER — Other Ambulatory Visit: Payer: Self-pay | Admitting: Surgery

## 2019-02-07 ENCOUNTER — Other Ambulatory Visit: Payer: Self-pay

## 2019-02-07 ENCOUNTER — Ambulatory Visit (INDEPENDENT_AMBULATORY_CARE_PROVIDER_SITE_OTHER): Payer: BC Managed Care – PPO | Admitting: Surgery

## 2019-02-07 VITALS — BP 125/80 | HR 81 | Temp 97.7°F | Resp 14 | Ht 64.0 in | Wt 129.8 lb

## 2019-02-07 DIAGNOSIS — D171 Benign lipomatous neoplasm of skin and subcutaneous tissue of trunk: Secondary | ICD-10-CM

## 2019-02-07 DIAGNOSIS — L723 Sebaceous cyst: Secondary | ICD-10-CM

## 2019-02-07 NOTE — Patient Instructions (Addendum)
You can try tylenol and ibuprofen for the pain or discomfort. Please see your follow up appointment listed below. We have removed a Cyst in our office today.    You have sutures under the skin that will dissolve and also dermabond (skin glue) on top of your skin which will come off on it's own in 10-14 days.  You may shower 02/08/2019.   Avoid Strenuous activities that will make you sweat during the next 48 hours to avoid the glue coming off prematurely. Avoid activities that will place pressure to this area of the body for 1-2 weeks to avoid re-injury to incision site.  Please see your follow-up appointment provided. We will see you back in office to make sure this area is healed and to review the final pathology. If you have any questions or concerns prior to this appointment, call our office and speak with a nurse.    Excision of Skin Cysts or Lesions Excision of a skin lesion refers to the removal of a section of skin by making small cuts (incisions) in the skin. This procedure may be done to remove a cancerous (malignant) or noncancerous (benign) growth on the skin. It is typically done to treat or prevent cancer or infection. It may also be done to improve cosmetic appearance. The procedure may be done to remove:  Cancerous growths, such as basal cell carcinoma, squamous cell carcinoma, or melanoma.  Noncancerous growths, such as a cyst or lipoma.  Growths, such as moles or skin tags, which may be removed for cosmetic reasons.  Various excision or surgical techniques may be used depending on your condition, the location of the lesion, and your overall health. Tell a health care provider about:  Any allergies you have.  All medicines you are taking, including vitamins, herbs, eye drops, creams, and over-the-counter medicines.  Any problems you or family members have had with anesthetic medicines.  Any blood disorders you have.  Any surgeries you have had.  Any medical  conditions you have.  Whether you are pregnant or may be pregnant. What are the risks? Generally, this is a safe procedure. However, problems may occur, including:  Bleeding.  Infection.  Scarring.  Recurrence of the cyst, lipoma, or cancer.  Changes in skin sensation or appearance, such as discoloration or swelling.  Reaction to the anesthetics.  Allergic reaction to surgical materials or ointments.  Damage to nerves, blood vessels, muscles, or other structures.  Continued pain.  What happens before the procedure?  Ask your health care provider about: ? Changing or stopping your regular medicines. This is especially important if you are taking diabetes medicines or blood thinners. ? Taking medicines such as aspirin and ibuprofen. These medicines can thin your blood. Do not take these medicines before your procedure if your health care provider instructs you not to.  You may be asked to take certain medicines.  You may be asked to stop smoking.  You may have an exam or testing.  Plan to have someone take you home after the procedure.  Plan to have someone help you with activities during recovery. What happens during the procedure?  To reduce your risk of infection: ? Your health care team will wash or sanitize their hands. ? Your skin will be washed with soap.  You will be given a medicine to numb the area (local anesthetic).  One of the following excision techniques will be performed.  At the end of any of these procedures, antibiotic ointment will be applied  as needed. Each of the following techniques may vary among health care providers and hospitals. Complete Surgical Excision The area of skin that needs to be removed will be marked with a pen. Using a small scalpel or scissors, the surgeon will gently cut around and under the lesion until it is completely removed. The lesion will be placed in a fluid and sent to the lab for examination. If necessary, bleeding  will be controlled with a device that delivers heat (electrocautery). The edges of the wound may be stitched (sutured) together, and a bandage (dressing) will be applied. This procedure may be performed to treat a cancerous growth or a noncancerous cyst or lesion. Excision of a Cyst The surgeon will make an incision on the cyst. The entire cyst will be removed through the incision. The incision may be closed with sutures. Shave Excision During shave excision, the surgeon will use a small blade or an electrically heated loop instrument to shave off the lesion. This may be done to remove a mole or a skin tag. The wound will usually be left to heal on its own without sutures. Punch Excision During punch excision, the surgeon will use a small tool that is like a cookie cutter or a hole punch to cut a circle shape out of the skin. The outer edges of the skin will be sutured together. This may be done to remove a mole or a scar or to perform a biopsy of the lesion. Mohs Micrographic Surgery During Mohs micrographic surgery, layers of the lesion will be removed with a scalpel or a loop instrument and will be examined right away under a microscope. Layers will be removed until all of the abnormal or cancerous tissue has been removed. This procedure is minimally invasive, and it ensures the best cosmetic outcome. It involves the removal of as little normal tissue as possible. Mohs is usually done to treat skin cancer, such as basal cell carcinoma or squamous cell carcinoma, particularly on the face and ears. Depending on the size of the surgical wound, it may be sutured closed. What happens after the procedure?  Return to your normal activities as told by your health care provider.  Talk with your health care provider to discuss any test results, treatment options, and if necessary, the need for more tests. This information is not intended to replace advice given to you by your health care provider. Make sure  you discuss any questions you have with your health care provider. Document Released: 05/19/2009 Document Revised: 07/31/2015 Document Reviewed: 04/10/2014 Elsevier Interactive Patient Education  [employer].

## 2019-02-07 NOTE — Progress Notes (Signed)
  Procedure Date:  02/07/2019  Pre-operative Diagnosis:  Lipoma of the back  Post-operative Diagnosis:  Sebaceous cyst of the back  Procedure:  Excision of sebaceous cyst of the back  Surgeon:  Melvyn Neth, MD  Assistant:  Murlean Caller, PA-S  Anesthesia:  6 ml 1% lidocaine with epi  Estimated Blood Loss:  2 ml  Specimens:  Sebaceous cyst  Complications:  None  Indications for Procedure:  This is a 43 y.o. female with diagnosis of a back lipoma.  The patient wishes to have this excised. The risks of bleeding, abscess or infection, injury to surrounding structures, and need for further procedures were all discussed with the patient and she was willing to proceed.  Description of Procedure: The patient was correctly identified at bedside.  Appropriate time-outs were performed.  The patient's back was prepped and draped in usual sterile fashion.  Upon more thorough inspection of the area, a small pore with blackhead was noted.  Local anesthetic was infused intradermally.  A thin elliptical 2.5 cm incision was made over the mass, and scalpel was used to dissect down the subcutaneous tissue to the mass itself.  It was noted that this was a sebaceous cyst instead of a lipoma.  Skin flaps were created sharply as well, and then the cyst was excised intact.  It was sent off to pathology.  The cavity was then irrigated.  There was one area of oozing which was controlled with suture.  The wound was then closed in two layers using 3-0 Vicryl and 4-0 Monocryl.  The incision was cleaned and sealed with DermaBond.  A dry gauze dressing with applied as the incision was located where her bra strap would go, in order to avoid any friction with the wound.  The patient tolerated the procedure well and all sharps were appropriately disposed of at the end of the case.   Melvyn Neth, MD

## 2019-02-14 ENCOUNTER — Ambulatory Visit (INDEPENDENT_AMBULATORY_CARE_PROVIDER_SITE_OTHER): Payer: BC Managed Care – PPO | Admitting: Physician Assistant

## 2019-02-14 ENCOUNTER — Other Ambulatory Visit: Payer: Self-pay

## 2019-02-14 ENCOUNTER — Encounter: Payer: Self-pay | Admitting: Physician Assistant

## 2019-02-14 VITALS — BP 132/83 | HR 82 | Temp 97.9°F | Resp 12 | Ht 64.0 in | Wt 131.2 lb

## 2019-02-14 DIAGNOSIS — L723 Sebaceous cyst: Secondary | ICD-10-CM

## 2019-02-14 DIAGNOSIS — Z09 Encounter for follow-up examination after completed treatment for conditions other than malignant neoplasm: Secondary | ICD-10-CM

## 2019-02-14 NOTE — Progress Notes (Signed)
[facility] POST-OP OFFICE VISIT  02/14/2019  HPI: Kim Conner is a 43 y.o. female7 days s/p excision of sebaceous cyst to the back with Dr Hampton Abbot.   Today, she reports she is doing well. No issues. No pain, erythema or drainage. No other complaints.   Vital signs: BP 132/83   Pulse 82   Temp 97.9 F (36.6 C) (Temporal)   Resp 12   Ht 5\' 4"  (1.626 m)   Wt 131 lb 3.2 oz (59.5 kg)   SpO2 98%   BMI 22.52 kg/m    Physical Exam: Constitutional: Well appearing female, NAD Skin: Well healed incision to the mid-back, dermabond present, no erythema or drainage.   Assessment/Plan: [name] 7 days s/p excision of sebaceous cyst to the back   - pain control as needed  - reviewed wound care  - Reviewed pathology: EIC  - rtc prn  -- Edison Simon, PA-C [facility] 02/14/2019, 10:32 AM [phone] M-F: 7am - 4pm

## 2019-02-14 NOTE — Patient Instructions (Signed)
Please call office with any questions or concerns.

## 2019-03-13 ENCOUNTER — Encounter: Payer: Self-pay | Admitting: Family Medicine

## 2019-03-13 NOTE — Telephone Encounter (Signed)
Please call or write back to patient and explain the outpatient testing procedures for scheduling a COVID test.  If family has no symptoms at this time, she should consider self-isolating in her own room and bathroom in the home to avoid exposing them.  They may have already been exposed, though.

## 2019-04-30 ENCOUNTER — Ambulatory Visit: Payer: BLUE CROSS/BLUE SHIELD | Admitting: Internal Medicine

## 2019-06-25 ENCOUNTER — Ambulatory Visit: Payer: BC Managed Care – PPO | Attending: Internal Medicine

## 2019-06-25 DIAGNOSIS — Z23 Encounter for immunization: Secondary | ICD-10-CM

## 2019-06-25 NOTE — Progress Notes (Signed)
   Covid-19 Vaccination Clinic  Name:  Kim Conner    MRN: [id number] DOB: February 19, 1976  06/25/2019  Kim Conner was observed post Covid-19 immunization for 15 minutes without incident. She was provided with Vaccine Information Sheet and instruction to access the V-Safe system.   Kim Conner was instructed to call 911 with any severe reactions post vaccine: Marland Kitchen Difficulty breathing  . Swelling of face and throat  . A fast heartbeat  . A bad rash all over body  . Dizziness and weakness   Immunizations Administered    Name Date Dose VIS Date Route   Pfizer COVID-19 Vaccine 06/25/2019 11:15 AM 0.3 mL 05/02/2018 Intramuscular   Manufacturer: [employer], [employer]   Lot: [id number]   [place]: [id number]

## 2019-07-17 ENCOUNTER — Ambulatory Visit: Payer: BC Managed Care – PPO | Attending: Internal Medicine

## 2019-07-17 DIAGNOSIS — Z23 Encounter for immunization: Secondary | ICD-10-CM

## 2019-07-17 NOTE — Progress Notes (Signed)
   Covid-19 Vaccination Clinic  Name:  Kinlie Geurts    MRN: [id number] DOB: 20-Jan-1976  07/17/2019  Ms. Mroczkowski was observed post Covid-19 immunization for 15 minutes without incident. She was provided with Vaccine Information Sheet and instruction to access the V-Safe system.   Ms. Sunny was instructed to call 911 with any severe reactions post vaccine: Marland Kitchen Difficulty breathing  . Swelling of face and throat  . A fast heartbeat  . A bad rash all over body  . Dizziness and weakness   Immunizations Administered    Name Date Dose VIS Date Route   Pfizer COVID-19 Vaccine 07/17/2019 11:56 AM 0.3 mL 05/02/2018 Intramuscular   Manufacturer: [place]   Lot: [id number]   [place]: [id number]

## 2019-07-18 ENCOUNTER — Other Ambulatory Visit: Payer: Self-pay | Admitting: Internal Medicine

## 2019-07-18 DIAGNOSIS — K8011 Calculus of gallbladder with chronic cholecystitis with obstruction: Secondary | ICD-10-CM

## 2019-07-18 DIAGNOSIS — Z1231 Encounter for screening mammogram for malignant neoplasm of breast: Secondary | ICD-10-CM

## 2019-07-27 ENCOUNTER — Ambulatory Visit
Admission: RE | Admit: 2019-07-27 | Discharge: 2019-07-27 | Disposition: A | Discharge status: Home / Self Care | Payer: BC Managed Care – PPO | Source: Ambulatory Visit | Attending: Internal Medicine | Admitting: Internal Medicine

## 2019-07-27 ENCOUNTER — Other Ambulatory Visit: Payer: Self-pay

## 2019-07-27 DIAGNOSIS — K8011 Calculus of gallbladder with chronic cholecystitis with obstruction: Secondary | ICD-10-CM | POA: Diagnosis not present

## 2019-09-25 ENCOUNTER — Ambulatory Visit
Admission: RE | Admit: 2019-09-25 | Discharge: 2019-09-25 | Disposition: A | Discharge status: Home / Self Care | Payer: BC Managed Care – PPO | Source: Ambulatory Visit | Attending: Internal Medicine | Admitting: Internal Medicine

## 2019-09-25 DIAGNOSIS — Z1231 Encounter for screening mammogram for malignant neoplasm of breast: Secondary | ICD-10-CM | POA: Diagnosis present

## 2019-10-15 ENCOUNTER — Other Ambulatory Visit: Payer: Self-pay

## 2019-10-15 MED ORDER — NORETHINDRONE 0.35 MG PO TABS: 1.0000 | ORAL_TABLET | Freq: Every day | ORAL | 1 refills | Status: DC

## 2019-10-19 ENCOUNTER — Encounter: Payer: BC Managed Care – PPO | Admitting: Certified Nurse Midwife

## 2019-11-09 ENCOUNTER — Other Ambulatory Visit: Payer: Self-pay | Admitting: Certified Nurse Midwife

## 2019-12-02 ENCOUNTER — Other Ambulatory Visit: Payer: Self-pay | Admitting: Certified Nurse Midwife

## 2019-12-03 ENCOUNTER — Other Ambulatory Visit: Payer: Self-pay | Admitting: Certified Nurse Midwife

## 2019-12-07 ENCOUNTER — Other Ambulatory Visit: Payer: Self-pay

## 2019-12-07 MED ORDER — NORETHINDRONE 0.35 MG PO TABS: 1.0000 | ORAL_TABLET | Freq: Every day | ORAL | 0 refills | Status: DC

## 2019-12-24 ENCOUNTER — Encounter: Payer: Self-pay | Admitting: Certified Nurse Midwife

## 2019-12-24 ENCOUNTER — Ambulatory Visit (INDEPENDENT_AMBULATORY_CARE_PROVIDER_SITE_OTHER): Payer: BC Managed Care – PPO | Admitting: Certified Nurse Midwife

## 2019-12-24 ENCOUNTER — Other Ambulatory Visit: Payer: Self-pay

## 2019-12-24 VITALS — BP 120/79 | HR 76 | Ht 64.0 in | Wt 130.5 lb

## 2019-12-24 DIAGNOSIS — Z23 Encounter for immunization: Secondary | ICD-10-CM

## 2019-12-24 DIAGNOSIS — Z1231 Encounter for screening mammogram for malignant neoplasm of breast: Secondary | ICD-10-CM | POA: Diagnosis not present

## 2019-12-24 DIAGNOSIS — Z01419 Encounter for gynecological examination (general) (routine) without abnormal findings: Secondary | ICD-10-CM

## 2019-12-24 MED ORDER — NORETHINDRONE 0.35 MG PO TABS: 1.0000 | ORAL_TABLET | Freq: Every day | ORAL | 4 refills | Status: AC

## 2019-12-24 NOTE — Patient Instructions (Addendum)
Norethindrone tablets (contraception) What is this medicine? NORETHINDRONE (nor eth IN drone) is an oral contraceptive. The product contains a female hormone known as a progestin. It is used to prevent pregnancy. This medicine may be used for other purposes; ask your health care provider or pharmacist if you have questions. COMMON BRAND NAME(S): Camila, Deblitane 28-Day, Errin, Heather, Jencycla, Jolivette, Lyza, Nor-QD, Nora-BE, Norlyroc, Ortho Micronor, Sharobel 28-Day What should I tell my health care provider before I take this medicine? They need to know if you have any of these conditions:  blood vessel disease or blood clots  breast, cervical, or vaginal cancer  diabetes  heart disease  kidney disease  liver disease  mental depression  migraine  seizures  stroke  vaginal bleeding  an unusual or allergic reaction to norethindrone, other medicines, foods, dyes, or preservatives  pregnant or trying to get pregnant  breast-feeding How should I use this medicine? Take this medicine by mouth with a glass of water. You may take it with or without food. Follow the directions on the prescription label. Take this medicine at the same time each day and in the order directed on the package. Do not take your medicine more often than directed. Contact your pediatrician regarding the use of this medicine in children. Special care may be needed. This medicine has been used in female children who have started having menstrual periods. A patient package insert for the product will be given with each prescription and refill. Read this sheet carefully each time. The sheet may change frequently. Overdosage: If you think you have taken too much of this medicine contact a poison control center or emergency room at once. NOTE: This medicine is only for you. Do not share this medicine with others. What if I miss a dose? Try not to miss a dose. Every time you miss a dose or take a dose late  your chance of pregnancy increases. When 1 pill is missed (even if only 3 hours late), take the missed pill as soon as possible and continue taking a pill each day at the regular time (use a back up method of birth control for the next 48 hours). If more than 1 dose is missed, use an additional birth control method for the rest of your pill pack until menses occurs. Contact your health care professional if more than 1 dose has been missed. What may interact with this medicine? Do not take this medicine with any of the following medications:  amprenavir or fosamprenavir  bosentan This medicine may also interact with the following medications:  antibiotics or medicines for infections, especially rifampin, rifabutin, rifapentine, and griseofulvin, and possibly penicillins or tetracyclines  aprepitant  barbiturate medicines, such as phenobarbital  carbamazepine  felbamate  modafinil  oxcarbazepine  phenytoin  ritonavir or other medicines for HIV infection or AIDS  St. John's wort  topiramate This list may not describe all possible interactions. Give your health care provider a list of all the medicines, herbs, non-prescription drugs, or dietary supplements you use. Also tell them if you smoke, drink alcohol, or use illegal drugs. Some items may interact with your medicine. What should I watch for while using this medicine? Visit your doctor or health care professional for regular checks on your progress. You will need a regular breast and pelvic exam and Pap smear while on this medicine. Use an additional method of birth control during the first cycle that you take these tablets. If you have any reason to think you   are pregnant, stop taking this medicine right away and contact your doctor or health care professional. If you are taking this medicine for hormone related problems, it may take several cycles of use to see improvement in your condition. This medicine does not protect you  against HIV infection (AIDS) or any other sexually transmitted diseases. What side effects may I notice from receiving this medicine? Side effects that you should report to your doctor or health care professional as soon as possible:  breast tenderness or discharge  pain in the abdomen, chest, groin or leg  severe headache  skin rash, itching, or hives  sudden shortness of breath  unusually weak or tired  vision or speech problems  yellowing of skin or eyes Side effects that usually do not require medical attention (report to your doctor or health care professional if they continue or are bothersome):  changes in sexual desire  change in menstrual flow  facial hair growth  fluid retention and swelling  headache  irritability  nausea  weight gain or loss This list may not describe all possible side effects. Call your doctor for medical advice about side effects. You may report side effects to FDA at 1-800-FDA-1088. Where should I keep my medicine? Keep out of the reach of children. Store at room temperature between 15 and 30 degrees C (59 and 86 degrees F). Throw away any unused medicine after the expiration date. NOTE: This sheet is a summary. It may not cover all possible information. If you have questions about this medicine, talk to your doctor, pharmacist, or health care provider.  2020 Elsevier/Gold Standard (2011-11-12 16:41:35)   Preventive Care 20-21 Years Old, Female Preventive care refers to visits with your health care provider and lifestyle choices that can promote health and wellness. This includes:  A yearly physical exam. This may also be called an annual well check.  Regular dental visits and eye exams.  Immunizations.  Screening for certain conditions.  Healthy lifestyle choices, such as eating a healthy diet, getting regular exercise, not using drugs or products that contain nicotine and tobacco, and limiting alcohol use. What can I expect  for my preventive care visit? Physical exam Your health care provider will check your:  Height and weight. This may be used to calculate body mass index (BMI), which tells if you are at a healthy weight.  Heart rate and blood pressure.  Skin for abnormal spots. Counseling Your health care provider may ask you questions about your:  Alcohol, tobacco, and drug use.  Emotional well-being.  Home and relationship well-being.  Sexual activity.  Eating habits.  Work and work Statistician.  Method of birth control.  Menstrual cycle.  Pregnancy history. What immunizations do I need?  Influenza (flu) vaccine  This is recommended every year. Tetanus, diphtheria, and pertussis (Tdap) vaccine  You may need a Td booster every 10 years. Varicella (chickenpox) vaccine  You may need this if you have not been vaccinated. Zoster (shingles) vaccine  You may need this after age 19. Measles, mumps, and rubella (MMR) vaccine  You may need at least one dose of MMR if you were born in 1957 or later. You may also need a second dose. Pneumococcal conjugate (PCV13) vaccine  You may need this if you have certain conditions and were not previously vaccinated. Pneumococcal polysaccharide (PPSV23) vaccine  You may need one or two doses if you smoke cigarettes or if you have certain conditions. Meningococcal conjugate (MenACWY) vaccine  You may need this  if you have certain conditions. Hepatitis A vaccine  You may need this if you have certain conditions or if you travel or work in places where you may be exposed to hepatitis A. Hepatitis B vaccine  You may need this if you have certain conditions or if you travel or work in places where you may be exposed to hepatitis B. Haemophilus influenzae type b (Hib) vaccine  You may need this if you have certain conditions. Human papillomavirus (HPV) vaccine  If recommended by your health care provider, you may need three doses over 6  months. You may receive vaccines as individual doses or as more than one vaccine together in one shot (combination vaccines). Talk with your health care provider about the risks and benefits of combination vaccines. What tests do I need? Blood tests  Lipid and cholesterol levels. These may be checked every 5 years, or more frequently if you are over 63 years old.  Hepatitis C test.  Hepatitis B test. Screening  Lung cancer screening. You may have this screening every year starting at age 75 if you have a 30-pack-year history of smoking and currently smoke or have quit within the past 15 years.  Colorectal cancer screening. All adults should have this screening starting at age 68 and continuing until age 82. Your health care provider may recommend screening at age 67 if you are at increased risk. You will have tests every 1-10 years, depending on your results and the type of screening test.  Diabetes screening. This is done by checking your blood sugar (glucose) after you have not eaten for a while (fasting). You may have this done every 1-3 years.  Mammogram. This may be done every 1-2 years. Talk with your health care provider about when you should start having regular mammograms. This may depend on whether you have a family history of breast cancer.  BRCA-related cancer screening. This may be done if you have a family history of breast, ovarian, tubal, or peritoneal cancers.  Pelvic exam and Pap test. This may be done every 3 years starting at age 57. Starting at age 61, this may be done every 5 years if you have a Pap test in combination with an HPV test. Other tests  Sexually transmitted disease (STD) testing.  Bone density scan. This is done to screen for osteoporosis. You may have this scan if you are at high risk for osteoporosis. Follow these instructions at home: Eating and drinking  Eat a diet that includes fresh fruits and vegetables, whole grains, lean protein, and low-fat  dairy.  Take vitamin and mineral supplements as recommended by your health care provider.  Do not drink alcohol if: ? Your health care provider tells you not to drink. ? You are pregnant, may be pregnant, or are planning to become pregnant.  If you drink alcohol: ? Limit how much you have to 0-1 drink a day. ? Be aware of how much alcohol is in your drink. In the [place] one drink equals one 12 oz bottle of beer (355 mL), one 5 oz glass of wine (148 mL), or one 1 oz glass of hard liquor (44 mL). Lifestyle  Take daily care of your teeth and gums.  Stay active. Exercise for at least 30 minutes on 5 or more days each week.  Do not use any products that contain nicotine or tobacco, such as cigarettes, e-cigarettes, and chewing tobacco. If you need help quitting, ask your health care provider.  If you are  sexually active, practice safe sex. Use a condom or other form of birth control (contraception) in order to prevent pregnancy and STIs (sexually transmitted infections).  If told by your health care provider, take low-dose aspirin daily starting at age 22. What's next?  Visit your health care provider once a year for a well check visit.  Ask your health care provider how often you should have your eyes and teeth checked.  Stay up to date on all vaccines. This information is not intended to replace advice given to you by your health care provider. Make sure you discuss any questions you have with your health care provider. Document Revised: 11/03/2017 Document Reviewed: 11/03/2017 Elsevier Patient Education  2020 [employer].

## 2019-12-24 NOTE — Progress Notes (Signed)
ANNUAL PREVENTATIVE CARE GYN  ENCOUNTER NOTE  Subjective:       Kim Conner is a 44 y.o. G63P1001 female here for a routine annual gynecologic exam.  Current questions: 1.  Considering pregnancy   Denies difficulty breathing or respiratory distress, chest pain, abdominal pain, excessive vaginal bleeding, dysuria, and leg pain or swelling.    Gynecologic History  Patient's last menstrual period was 12/13/2019. Period Cycle (Days): 28 Period Duration (Days): 4 Period Pattern: Regular Menstrual Flow: Moderate Menstrual Control: Maxi pad Menstrual Control Change Freq (Hours): 3-4 Dysmenorrhea: (!) Mild Dysmenorrhea Symptoms: Cramping, Headache  Contraception: oral progesterone-only contraceptive  Last Pap: 11/2018. Results were: normal  Last mammogram: 09/2019. Results were: BI-RADS 1  Obstetric History  OB History  Gravida Para Term Preterm AB Living  1 1 1     1   SAB TAB Ectopic Multiple Live Births        0 1    # Outcome Date GA Lbr Len/2nd Weight Sex Delivery Anes PTL Lv  1 Term 06/15/17 [redacted]w[redacted]d / 09:30 7 lb 2.6 oz (3.25 kg) M Vag-Spont EPI, Local  LIV    Past Medical History:  Diagnosis Date  . Allergy   . Gall stones   . Hypertension   . Iodine goiter   . Left ovarian cyst     Past Surgical History:  Procedure Laterality Date  . none      Current Outpatient Medications on File Prior to Visit  Medication Sig Dispense Refill  . Cholecalciferol (VITAMIN D3) 5000 units CAPS Take 1 capsule (5,000 Units total) by mouth daily. 120 capsule 2  . norethindrone (MICRONOR) 0.35 MG tablet Take 1 tablet (0.35 mg total) by mouth daily. 84 tablet 0   No current facility-administered medications on file prior to visit.    Allergies  Allergen Reactions  . Chromium Dermatitis, Hives, Itching and Rash  . Cobalt Dermatitis, Hives, Itching and Rash  . Nickel Dermatitis, Hives, Itching and Rash  . Nitrofurantoin Nausea And Vomiting and Rash    Social History    Socioeconomic History  . Marital status: Married    Spouse name: Not on file  . Number of children: Not on file  . Years of education: Not on file  . Highest education level: Not on file  Occupational History  . Not on file  Tobacco Use  . Smoking status: Never Smoker  . Smokeless tobacco: Never Used  Vaping Use  . Vaping Use: Never used  Substance and Sexual Activity  . Alcohol use: Yes    Comment: occasional  . Drug use: No  . Sexual activity: Yes    Partners: Male    Birth control/protection: Pill  Other Topics Concern  . Not on file  Social History Narrative  . Not on file   Social Determinants of Health   Financial Resource Strain:   . Difficulty of Paying Living Expenses: Not on file  Food Insecurity:   . Worried About Charity fundraiser in the Last Year: Not on file  . Ran Out of Food in the Last Year: Not on file  Transportation Needs:   . Lack of Transportation (Medical): Not on file  . Lack of Transportation (Non-Medical): Not on file  Physical Activity:   . Days of Exercise per Week: Not on file  . Minutes of Exercise per Session: Not on file  Stress:   . Feeling of Stress : Not on file  Social Connections:   . Frequency of Communication  with Friends and Family: Not on file  . Frequency of Social Gatherings with Friends and Family: Not on file  . Attends Religious Services: Not on file  . Active Member of Clubs or Organizations: Not on file  . Attends Archivist Meetings: Not on file  . Marital Status: Not on file  Intimate Partner Violence:   . Fear of Current or Ex-Partner: Not on file  . Emotionally Abused: Not on file  . Physically Abused: Not on file  . Sexually Abused: Not on file    Family History  Problem Relation Age of Onset  . Hyperlipidemia Mother   . Hypertension Mother   . Migraines Mother   . Depression Mother   . Gallstones Mother   . Goiter Mother   . Cancer Father        prostate  . Hypertension Father   .  Gallstones Father   . Clotting disorder Father   . Goiter Father   . Breast cancer Maternal Aunt   . Stomach cancer Maternal Aunt   . Cirrhosis Maternal Uncle   . Pancreatic cancer Paternal Uncle   . Colon cancer Neg Hx     The following portions of the patient's history were reviewed and updated as appropriate: allergies, current medications, past family history, past medical history, past social history, past surgical history and problem list.  Review of Systems  ROS negative except as noted above. Information obtained from patient.    Objective:   BP 120/79   Pulse 76   Ht 5\' 4"  (1.626 m)   Wt 130 lb 8 oz (59.2 kg)   LMP 12/13/2019   BMI 22.40 kg/m   CONSTITUTIONAL: Well-developed, well-nourished female in no acute distress.   PSYCHIATRIC: Normal mood and affect. Normal behavior. Normal judgment and thought content.  [place]: Alert and oriented to person, place, and time. Normal muscle tone coordination. No cranial nerve deficit noted.  HENT:  Normocephalic, atraumatic, External right and left ear normal.   EYES: Conjunctivae and EOM are normal. Pupils are equal and round.   NECK: Normal range of motion, supple, no masses.  Englarged thyroid.   SKIN: Skin is warm and dry. No rash noted. Not diaphoretic. No erythema. No pallor.  CARDIOVASCULAR: Normal heart rate noted, regular rhythm, no murmur.  RESPIRATORY: Clear to auscultation bilaterally. Effort and breath sounds normal, no problems with respiration noted.  BREASTS: Symmetric in size. No masses, skin changes, nipple drainage, or lymphadenopathy.  ABDOMEN: Soft, normal bowel sounds, no distention noted.  No tenderness, rebound or guarding.   PELVIC:  External Genitalia: Normal except for erythema  Vagina: Normal  Cervix: Normal  Uterus: Normal  Adnexa: Normal   MUSCULOSKELETAL: Normal range of motion. No tenderness.  No cyanosis, clubbing, or edema.  2+ distal pulses.  LYMPHATIC: No Axillary,  Supraclavicular, or Inguinal Adenopathy.  Assessment:   Annual gynecologic examination 44 y.o.   Contraception: oral progesterone-only contraceptive   Normal BMI   Problem List Items Addressed This Visit    None    Visit Diagnoses    Well woman exam    -  Primary   Relevant Orders   MM 3D SCREEN BREAST BILATERAL   Need for influenza vaccination       Screening mammogram for breast cancer       Relevant Orders   MM 3D SCREEN BREAST BILATERAL      Plan:   Pap: Not needed  Mammogram: Ordered  Labs: Managed by PCP  Routine  preventative health maintenance measures emphasized: Exercise/Diet/Weight control, Tobacco Warnings, Alcohol/Substance use risks and Stress Management; see AVS  Rx Camila, see orders  Reviewed red flag symptoms and when to call  Return to [place] for [employer] or sooner if needed   Dani Gobble, CNM  [facility], [facility] 12/24/19 11:27 AM

## 2019-12-26 ENCOUNTER — Encounter: Payer: Self-pay | Admitting: Family Medicine

## 2020-08-11 ENCOUNTER — Other Ambulatory Visit: Payer: Self-pay | Admitting: Internal Medicine

## 2020-08-11 DIAGNOSIS — Z1231 Encounter for screening mammogram for malignant neoplasm of breast: Secondary | ICD-10-CM

## 2020-09-25 ENCOUNTER — Ambulatory Visit
Admission: RE | Admit: 2020-09-25 | Discharge: 2020-09-25 | Disposition: A | Discharge status: Home / Self Care | Payer: BC Managed Care – PPO | Source: Ambulatory Visit | Attending: Internal Medicine | Admitting: Internal Medicine

## 2020-09-25 ENCOUNTER — Other Ambulatory Visit: Payer: Self-pay

## 2020-09-25 DIAGNOSIS — Z1231 Encounter for screening mammogram for malignant neoplasm of breast: Secondary | ICD-10-CM | POA: Diagnosis not present

## 2020-12-29 ENCOUNTER — Encounter: Payer: BC Managed Care – PPO | Admitting: Certified Nurse Midwife

## 2021-01-01 ENCOUNTER — Encounter: Payer: Self-pay | Admitting: Certified Nurse Midwife

## 2021-08-14 ENCOUNTER — Other Ambulatory Visit: Payer: Self-pay | Admitting: Internal Medicine

## 2021-08-14 DIAGNOSIS — Z1231 Encounter for screening mammogram for malignant neoplasm of breast: Secondary | ICD-10-CM

## 2021-09-28 ENCOUNTER — Ambulatory Visit
Admission: RE | Admit: 2021-09-28 | Discharge: 2021-09-28 | Disposition: A | Discharge status: Home / Self Care | Payer: BC Managed Care – PPO | Source: Ambulatory Visit | Attending: Internal Medicine | Admitting: Internal Medicine

## 2021-09-28 DIAGNOSIS — Z1231 Encounter for screening mammogram for malignant neoplasm of breast: Secondary | ICD-10-CM | POA: Insufficient documentation

## 2022-08-17 ENCOUNTER — Other Ambulatory Visit: Payer: Self-pay

## 2022-08-17 DIAGNOSIS — Z1231 Encounter for screening mammogram for malignant neoplasm of breast: Secondary | ICD-10-CM

## 2022-09-30 ENCOUNTER — Ambulatory Visit
Admission: RE | Admit: 2022-09-30 | Discharge: 2022-09-30 | Disposition: A | Discharge status: Home / Self Care | Payer: BC Managed Care – PPO | Source: Ambulatory Visit | Attending: Internal Medicine | Admitting: Internal Medicine

## 2022-09-30 DIAGNOSIS — Z1231 Encounter for screening mammogram for malignant neoplasm of breast: Secondary | ICD-10-CM | POA: Insufficient documentation

## 2022-11-24 IMAGING — MG MM DIGITAL SCREENING BILAT W/ TOMO AND CAD
8 series · 9 of 24 positions shown · non-contrast
Comparison: Previous exam(s).

CLINICAL DATA: Screening.

EXAM:
DIGITAL SCREENING BILATERAL MAMMOGRAM WITH TOMOSYNTHESIS AND CAD
TECHNIQUE: Bilateral screening digital craniocaudal and mediolateral oblique
mammograms were obtained. Bilateral screening digital breast
tomosynthesis was performed. The images were evaluated with
computer-aided detection.

[R MLO synth-2D]
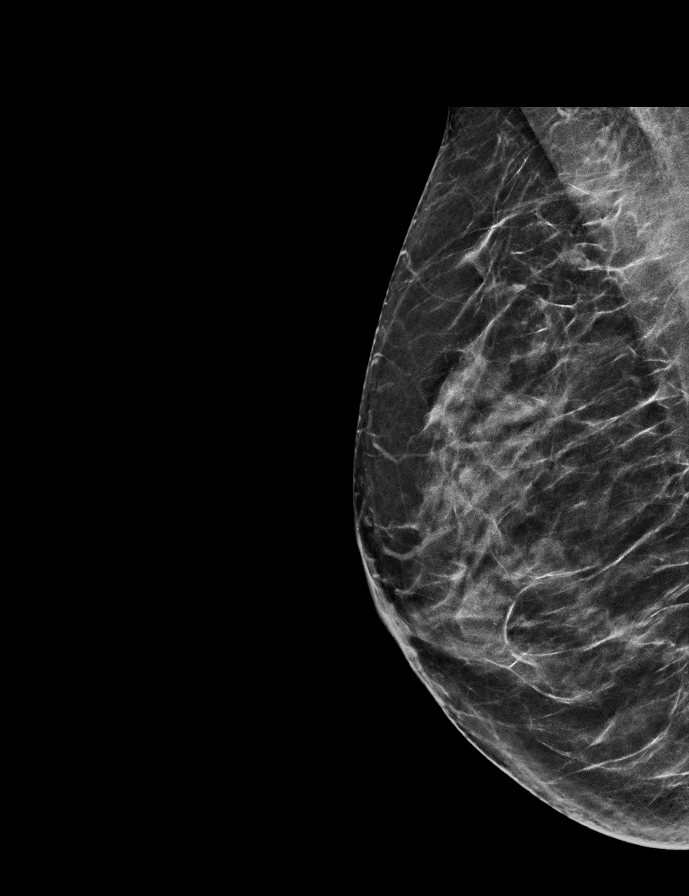

[L MLO synth-2D]
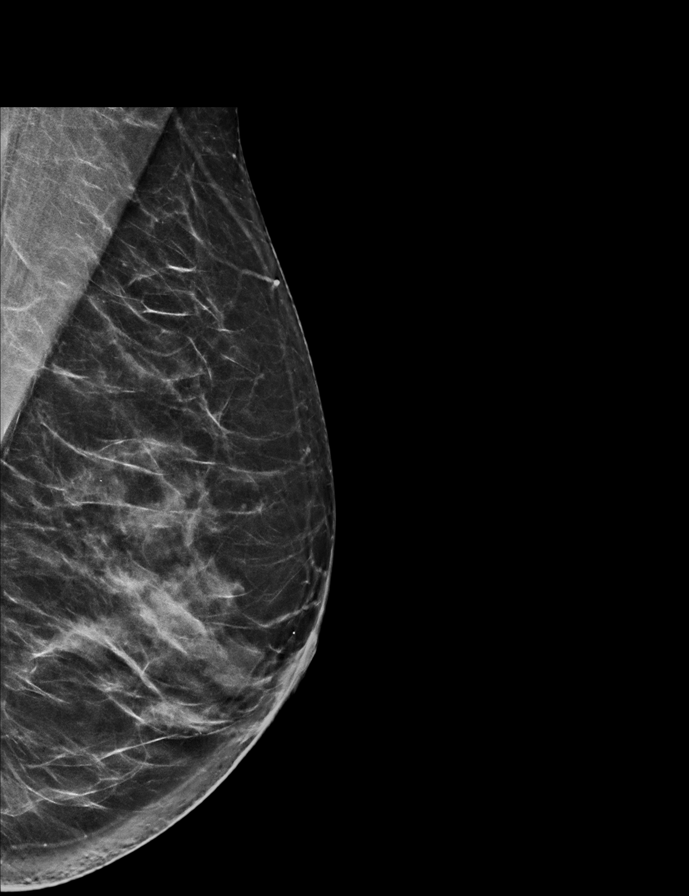

[R CC synth-2D]
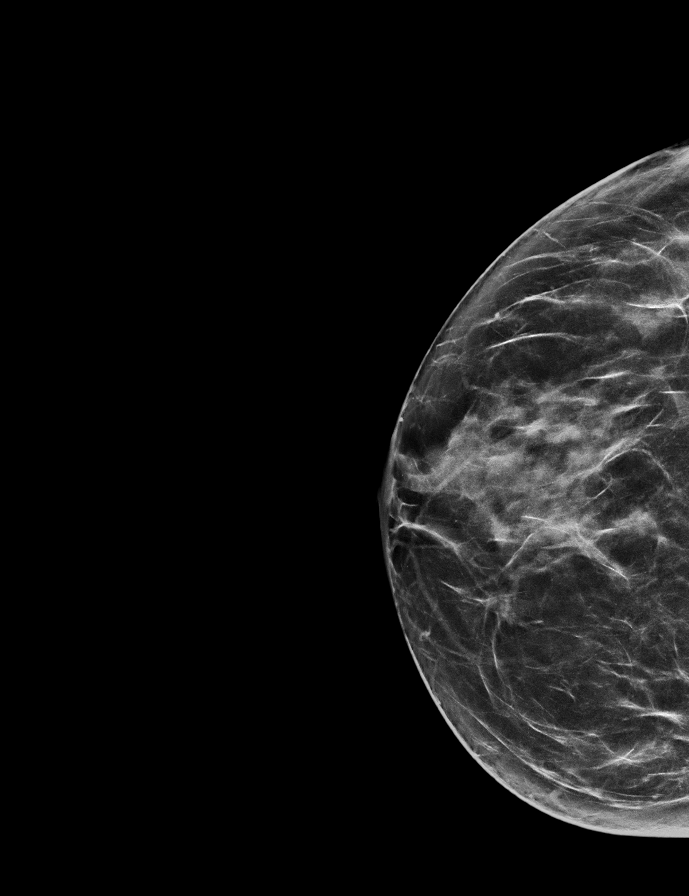

[L CC synth-2D]
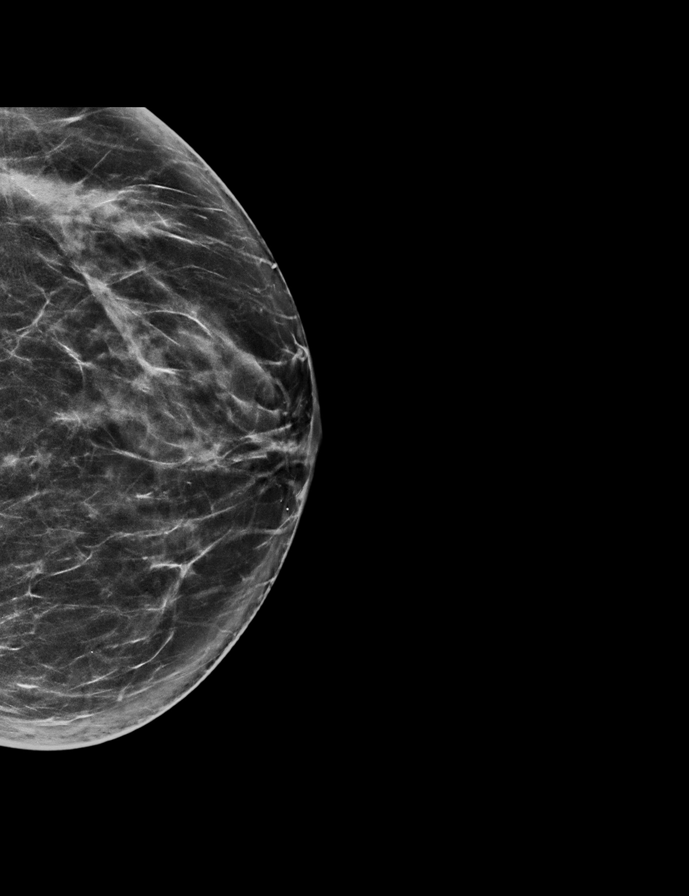

[R MLO tomo · 2 of 62 frames shown]
[frame 21/62]
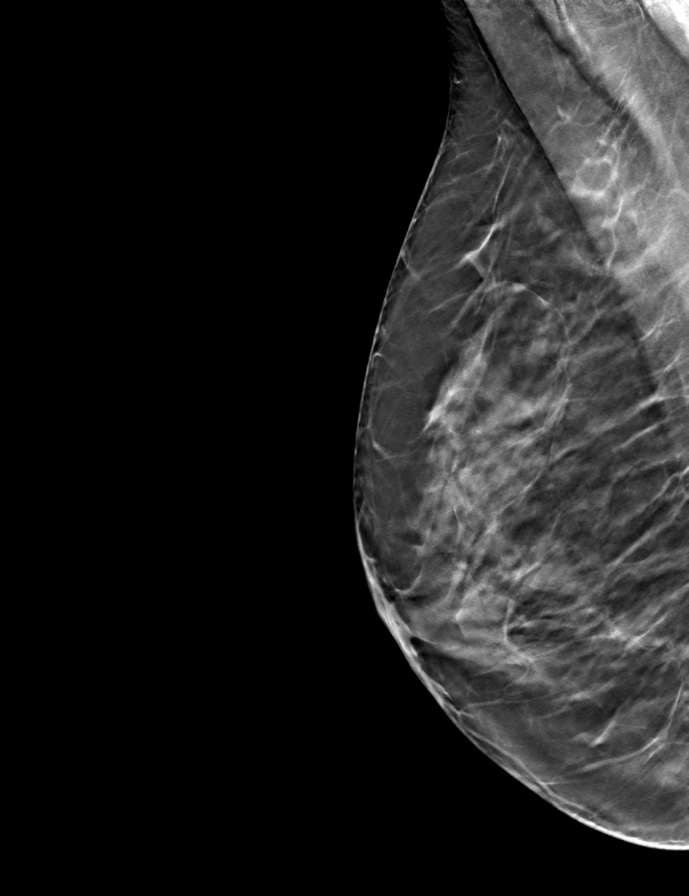
[frame 31/62]
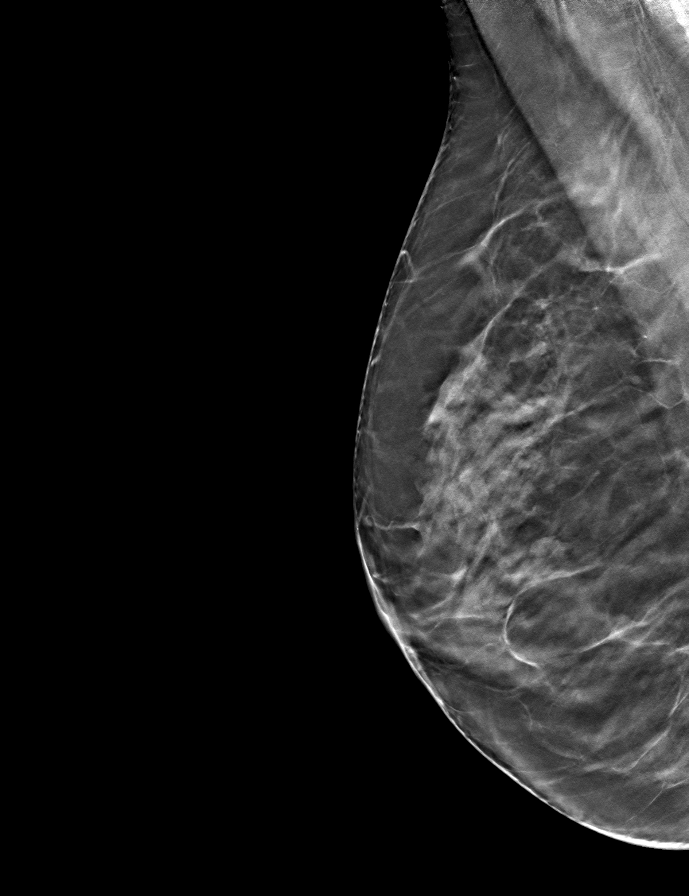

[L MLO tomo · tomo slice 35/69.0]
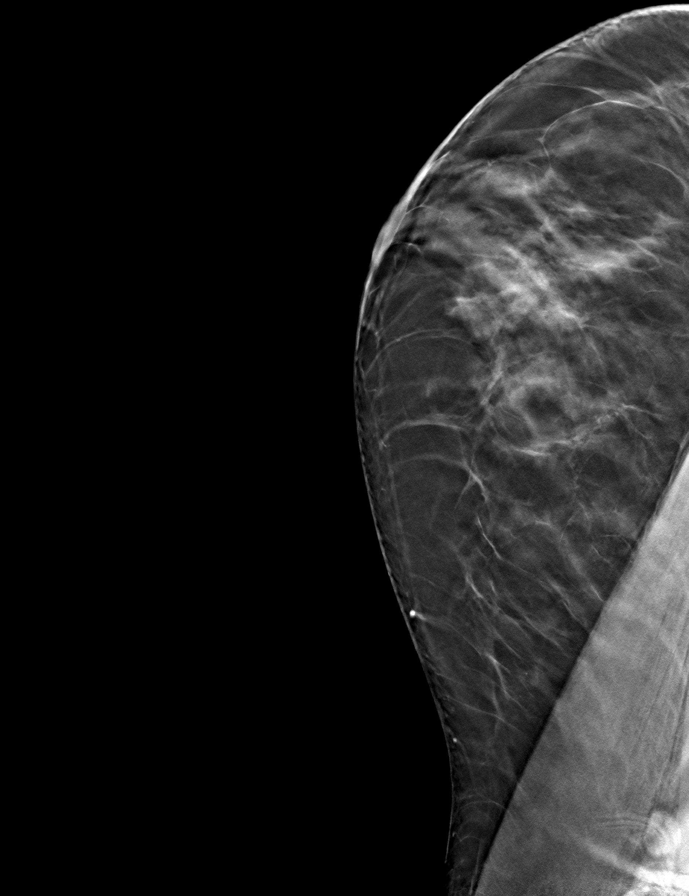

[R CC tomo · tomo slice 34/67.0]
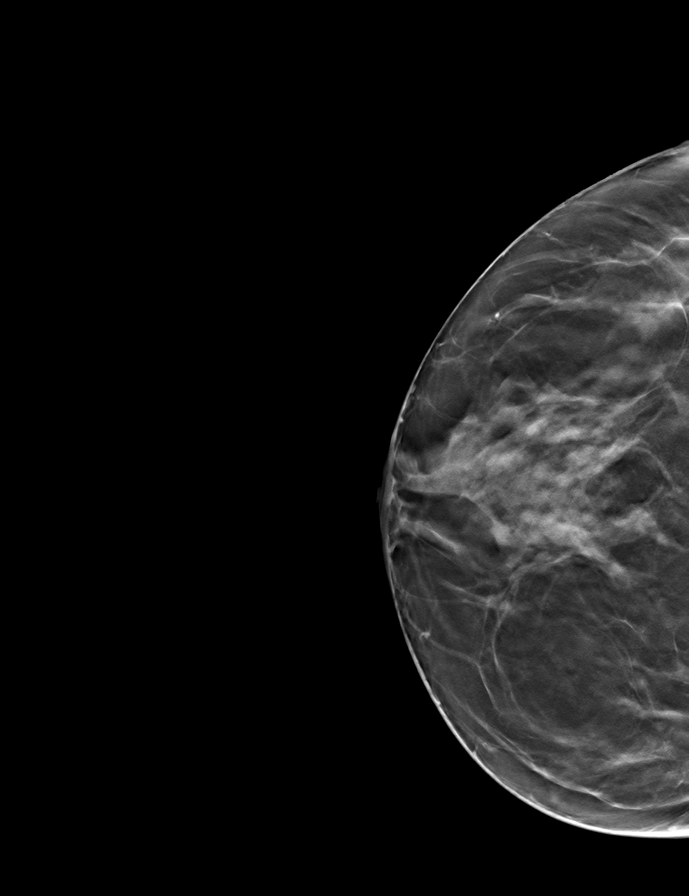

[L CC tomo · tomo slice 35/68.0]
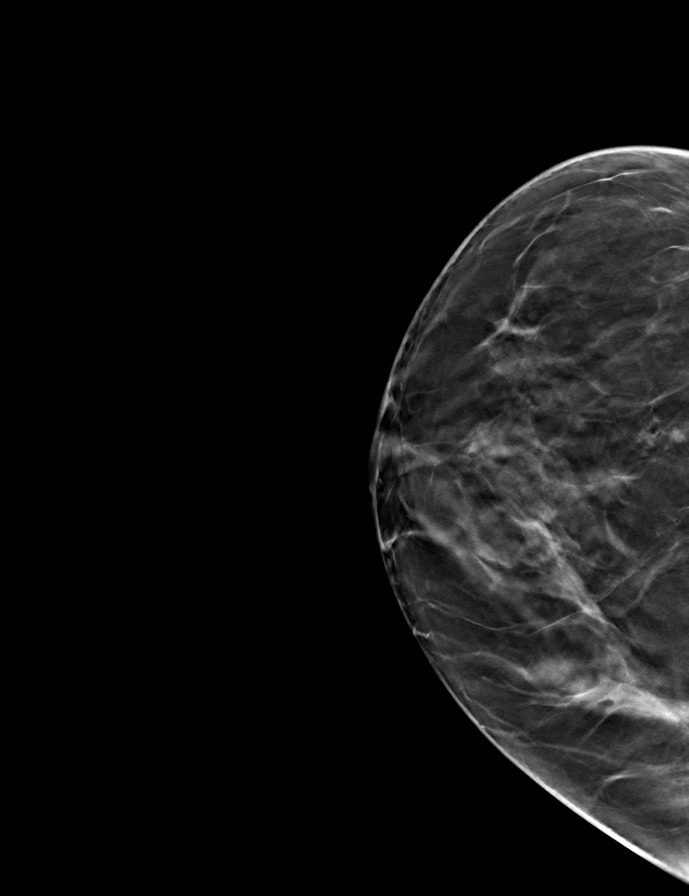

[9 of 24 positions shown; findings below may reference images not displayed]

ACR Breast Density Category c: The breast tissue is heterogeneously
dense, which may obscure small masses.
FINDINGS: There are no findings suspicious for malignancy.
IMPRESSION: No mammographic evidence of malignancy. A result letter of this
screening mammogram will be mailed directly to the patient.

RECOMMENDATION:
Screening mammogram in one year. (Code:Q3-W-BC3)

BI-RADS CATEGORY  1: Negative.

## 2023-06-22 ENCOUNTER — Ambulatory Visit

## 2023-06-22 DIAGNOSIS — D128 Benign neoplasm of rectum: Secondary | ICD-10-CM | POA: Diagnosis not present

## 2023-06-22 DIAGNOSIS — Z1211 Encounter for screening for malignant neoplasm of colon: Secondary | ICD-10-CM | POA: Diagnosis present

## 2023-08-17 ENCOUNTER — Other Ambulatory Visit: Payer: Self-pay | Admitting: Internal Medicine

## 2023-08-17 DIAGNOSIS — Z1231 Encounter for screening mammogram for malignant neoplasm of breast: Secondary | ICD-10-CM

## 2023-10-03 ENCOUNTER — Ambulatory Visit
Admission: RE | Admit: 2023-10-03 | Discharge: 2023-10-03 | Disposition: A | Discharge status: Home / Self Care | Source: Ambulatory Visit | Attending: Internal Medicine | Admitting: Internal Medicine

## 2023-10-03 DIAGNOSIS — Z1231 Encounter for screening mammogram for malignant neoplasm of breast: Secondary | ICD-10-CM | POA: Insufficient documentation
